# Patient Record
Sex: Male | Born: 1980 | Race: White | Hispanic: No | Marital: Single | Smoking: Never smoker
Health system: Southern US, Community
[De-identification: ages and names within clinical notes are randomized; demographics above are authoritative.]

## PROBLEM LIST (undated history)

## (undated) DIAGNOSIS — L0390 Cellulitis, unspecified: Secondary | ICD-10-CM

## (undated) DIAGNOSIS — I10 Essential (primary) hypertension: Secondary | ICD-10-CM

## (undated) DIAGNOSIS — E7800 Pure hypercholesterolemia, unspecified: Secondary | ICD-10-CM

## (undated) DIAGNOSIS — E119 Type 2 diabetes mellitus without complications: Secondary | ICD-10-CM

## (undated) DIAGNOSIS — G4730 Sleep apnea, unspecified: Secondary | ICD-10-CM

## (undated) HISTORY — PX: OTHER SURGICAL HISTORY: SHX169

## (undated) HISTORY — DX: Type 2 diabetes mellitus without complications: E11.9

---

## 2007-09-28 ENCOUNTER — Inpatient Hospital Stay (HOSPITAL_COMMUNITY): Admission: EM | Admit: 2007-09-28 | Discharge: 2007-10-03 | Payer: Self-pay | Admitting: Emergency Medicine

## 2007-09-29 ENCOUNTER — Encounter (INDEPENDENT_AMBULATORY_CARE_PROVIDER_SITE_OTHER): Payer: Self-pay | Admitting: Internal Medicine

## 2007-09-29 ENCOUNTER — Ambulatory Visit: Payer: Self-pay | Admitting: Vascular Surgery

## 2007-12-13 ENCOUNTER — Emergency Department (HOSPITAL_COMMUNITY): Admission: EM | Admit: 2007-12-13 | Discharge: 2007-12-13 | Payer: Self-pay | Admitting: Emergency Medicine

## 2008-07-22 ENCOUNTER — Inpatient Hospital Stay (HOSPITAL_COMMUNITY): Admission: EM | Admit: 2008-07-22 | Discharge: 2008-07-27 | Payer: Self-pay | Admitting: Emergency Medicine

## 2009-04-11 ENCOUNTER — Ambulatory Visit: Payer: Self-pay | Admitting: Diagnostic Radiology

## 2009-04-11 ENCOUNTER — Emergency Department (HOSPITAL_BASED_OUTPATIENT_CLINIC_OR_DEPARTMENT_OTHER): Admission: EM | Admit: 2009-04-11 | Discharge: 2009-04-11 | Payer: Self-pay | Admitting: Emergency Medicine

## 2009-07-26 ENCOUNTER — Other Ambulatory Visit: Payer: Self-pay | Admitting: Emergency Medicine

## 2009-07-26 ENCOUNTER — Inpatient Hospital Stay (HOSPITAL_COMMUNITY): Admission: EM | Admit: 2009-07-26 | Discharge: 2009-07-28 | Payer: Self-pay

## 2010-05-22 LAB — BASIC METABOLIC PANEL
BUN: 13 mg/dL (ref 6–23)
Creatinine, Ser: 0.8 mg/dL (ref 0.4–1.5)
GFR calc non Af Amer: >60 mL/min (ref >60)

## 2010-05-22 LAB — COMPREHENSIVE METABOLIC PANEL
AST: 15 U/L (ref 0–37)
Albumin: 3.4 g/dL — ABNORMAL LOW (ref 3.5–5.2)
BUN: 8 mg/dL (ref 6–23)
Creatinine, Ser: 0.9 mg/dL (ref 0.4–1.5)
Glucose, Bld: 130 mg/dL — ABNORMAL HIGH (ref 70–99)
Sodium: 137 mEq/L (ref 135–145)
Total Bilirubin: 2.2 mg/dL — ABNORMAL HIGH (ref 0.3–1.2)
Total Protein: 6.9 g/dL (ref 6.0–8.3)

## 2010-05-22 LAB — LIPID PANEL
Cholesterol: 95 mg/dL (ref 0–200)
Total CHOL/HDL Ratio: 2.3 RATIO
VLDL: 8 mg/dL (ref 0–40)

## 2010-05-22 LAB — CULTURE, BLOOD (ROUTINE X 2): Culture: NO GROWTH

## 2010-05-22 LAB — DIFFERENTIAL
Basophils Absolute: 0.2 10*3/uL — ABNORMAL HIGH (ref 0.0–0.1)
Basophils Relative: 1 % (ref 0–1)
Eosinophils Absolute: 0 10*3/uL (ref 0.0–0.7)
Monocytes Absolute: 0.9 10*3/uL (ref 0.1–1.0)
Monocytes Absolute: 1.1 10*3/uL — ABNORMAL HIGH (ref 0.1–1.0)
Monocytes Relative: 8 % (ref 3–12)
Monocytes Relative: 8 % (ref 3–12)

## 2010-05-22 LAB — CBC
Hemoglobin: 14.5 g/dL (ref 13.0–17.0)
Hemoglobin: 15 g/dL (ref 13.0–17.0)
MCV: 89.4 fL (ref 78.0–100.0)
MCV: 90.7 fL (ref 78.0–100.0)
RBC: 4.53 MIL/uL (ref 4.22–5.81)
RBC: 4.8 MIL/uL (ref 4.22–5.81)
WBC: 12.6 10*3/uL — ABNORMAL HIGH (ref 4.0–10.5)
WBC: 13.4 10*3/uL — ABNORMAL HIGH (ref 4.0–10.5)

## 2010-05-22 LAB — PHOSPHORUS: Phosphorus: 2 mg/dL — ABNORMAL LOW (ref 2.3–4.6)

## 2010-05-22 LAB — HEMOGLOBIN A1C: Hgb A1c MFr Bld: 4.9 % (ref <5.7)

## 2010-05-24 IMAGING — CR DG CHEST 2V
2 series · 2 of 2 positions shown · non-contrast
Comparison: None

CLINICAL DATA: Fever and cough

CHEST - 2 VIEW

[w chest pa *]
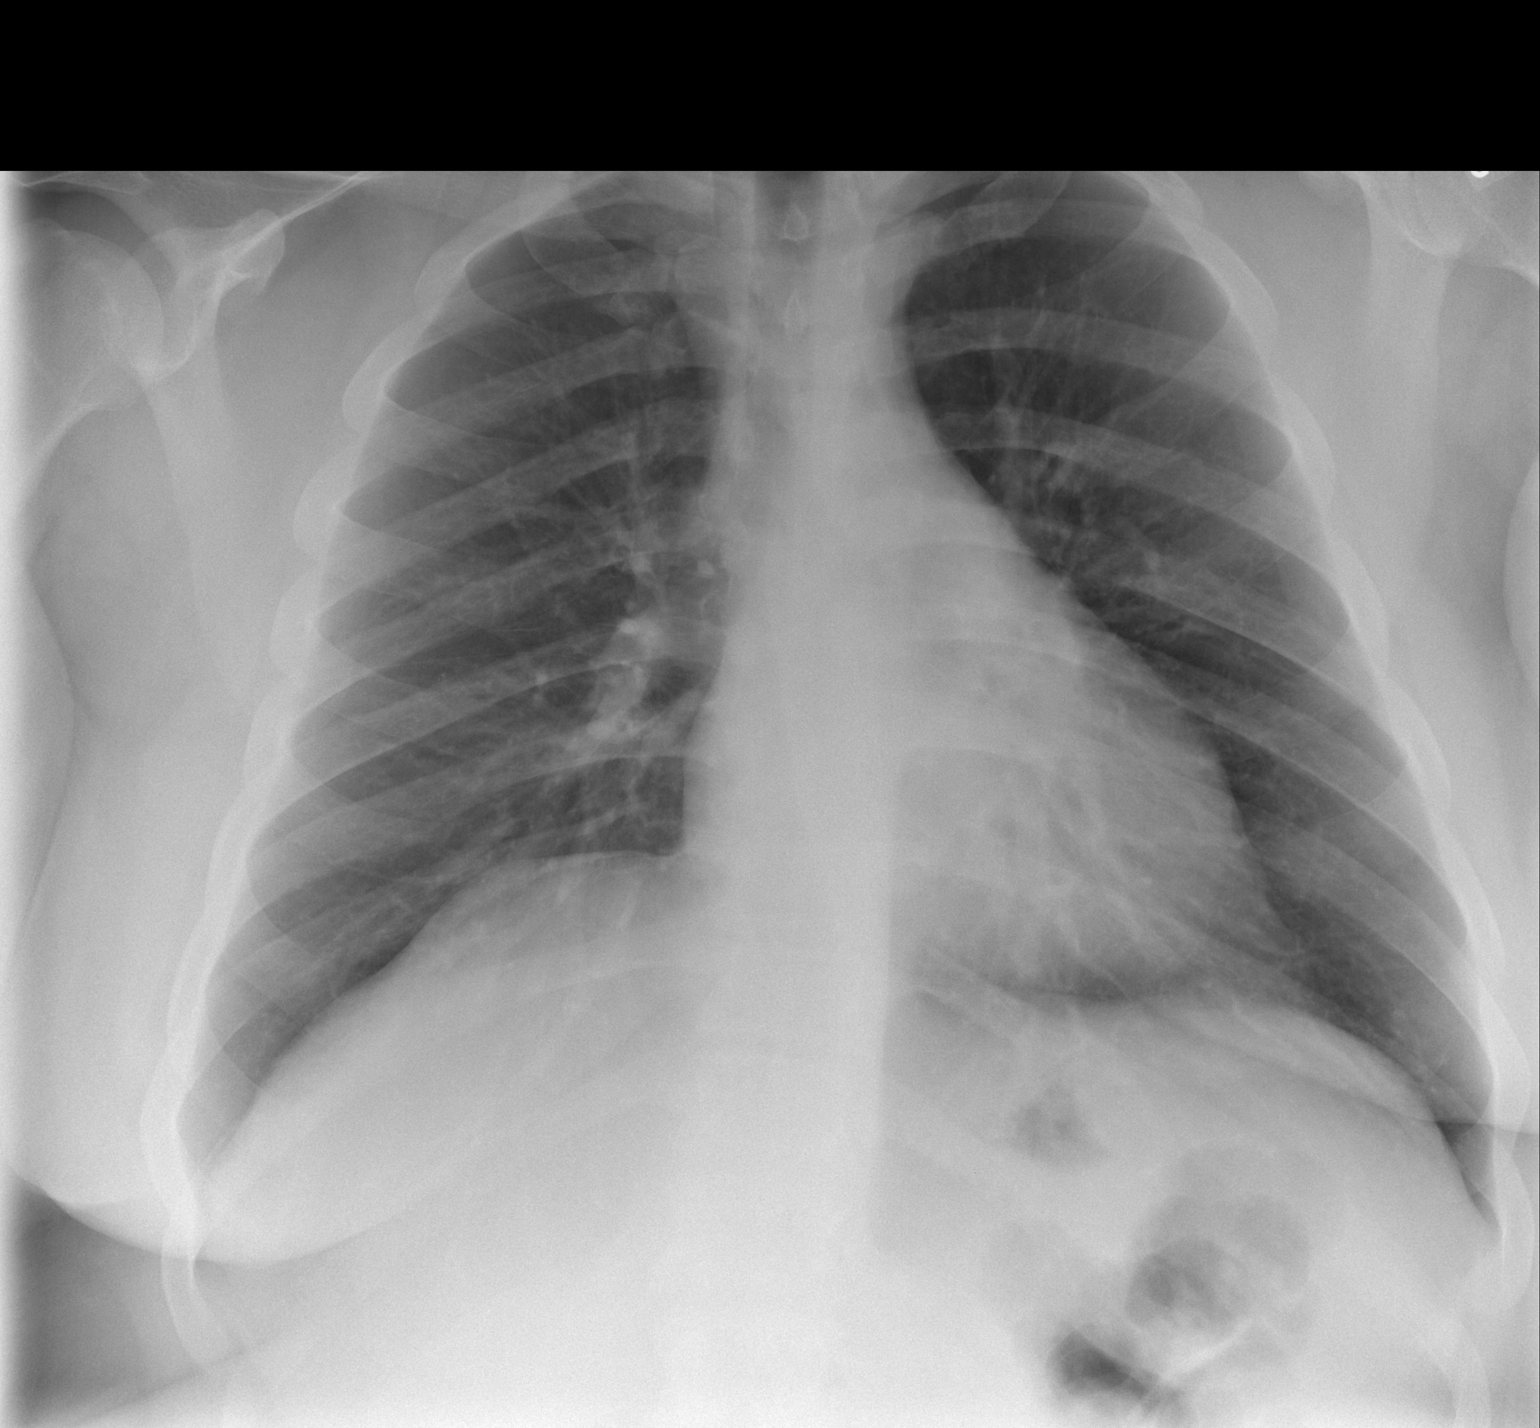

[w chest lat *]
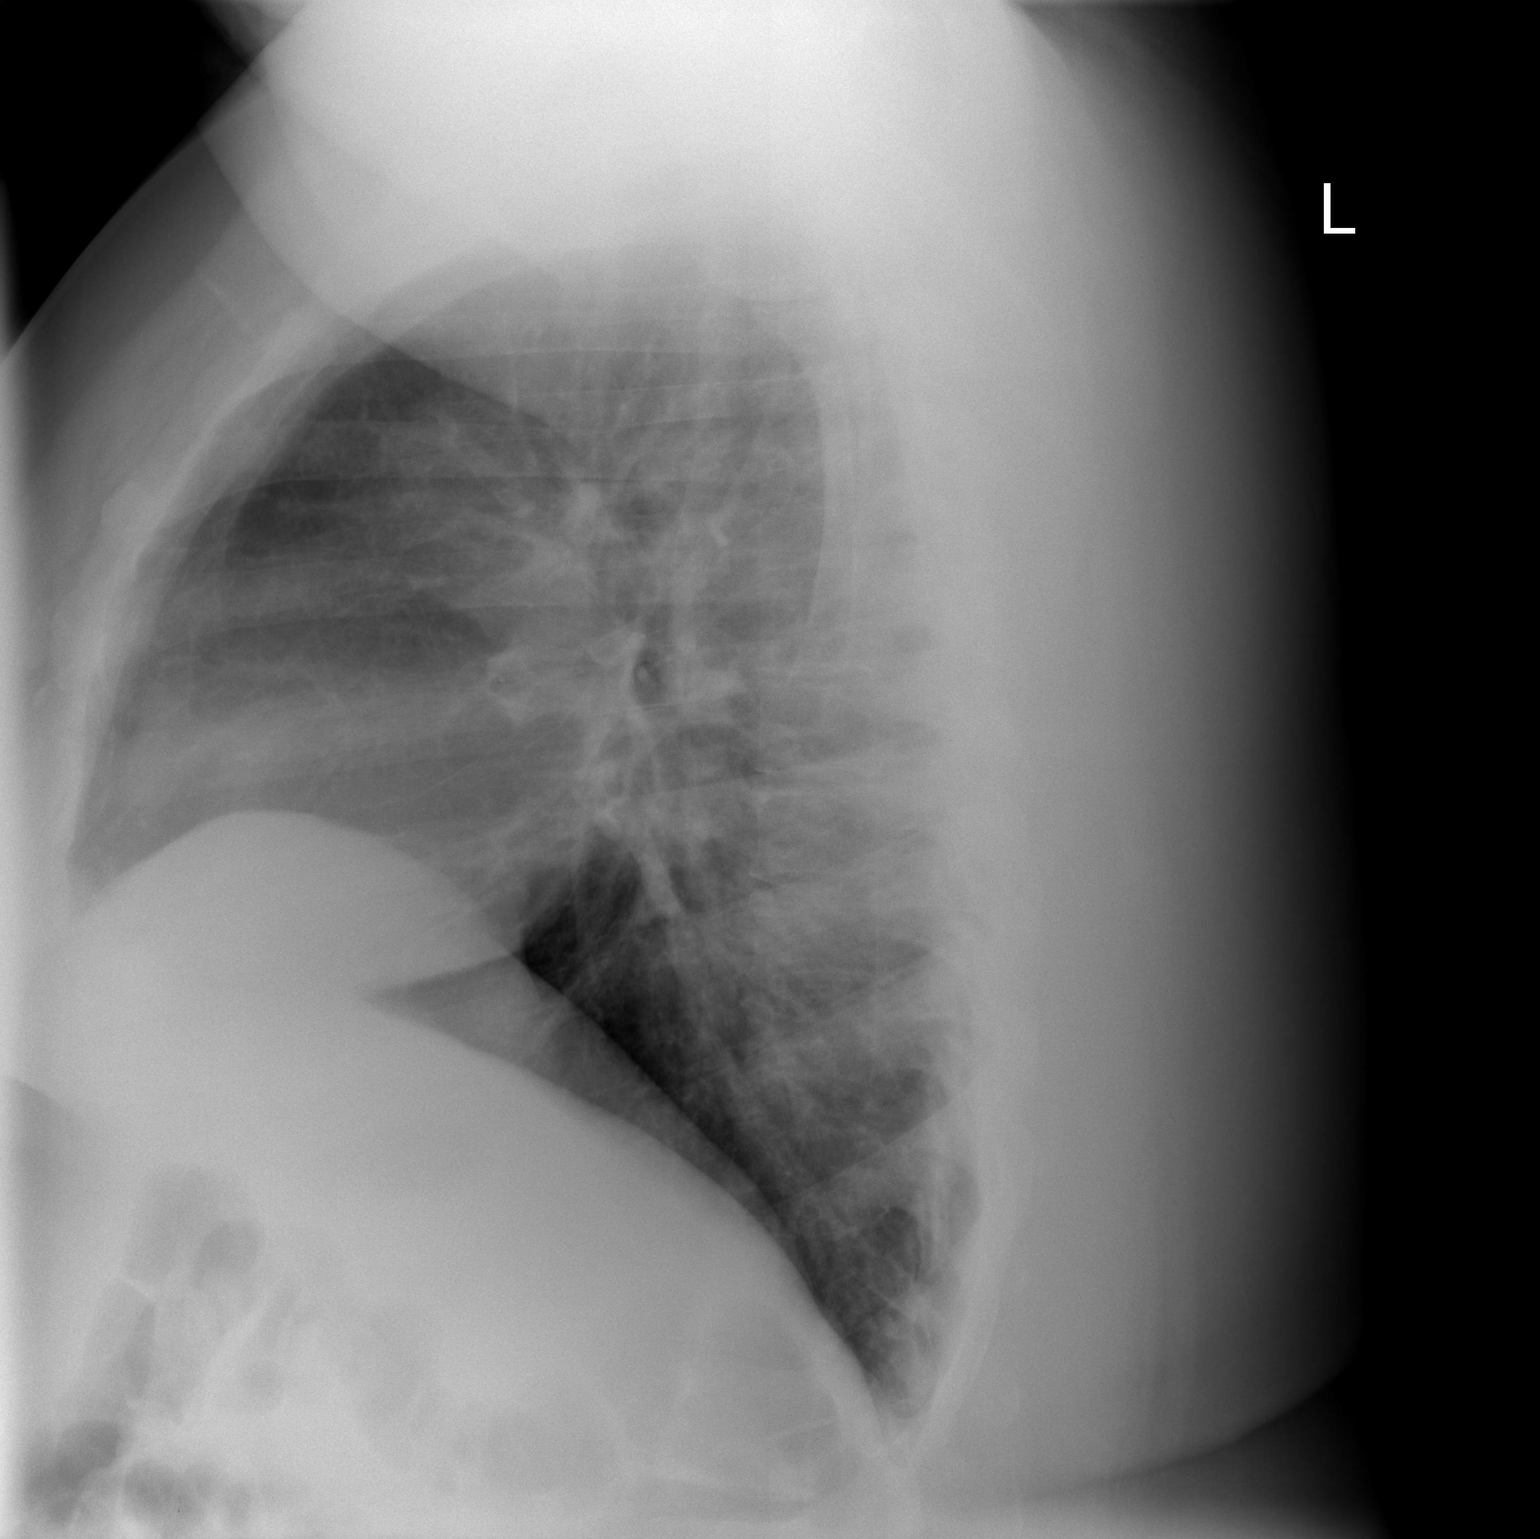

[2 of 2 positions shown; findings below may reference images not displayed]

FINDINGS: The heart size and mediastinal contours are within normal
limits.  Both lungs are clear.  The visualized skeletal structures
are unremarkable.
IMPRESSION: No active cardiopulmonary disease.

## 2010-06-08 ENCOUNTER — Emergency Department (HOSPITAL_BASED_OUTPATIENT_CLINIC_OR_DEPARTMENT_OTHER)
Admission: EM | Admit: 2010-06-08 | Discharge: 2010-06-09 | Disposition: A | Discharge status: Nursing Home (Includes ICF) | Payer: Self-pay | Source: Home / Self Care | Attending: Emergency Medicine | Admitting: Emergency Medicine

## 2010-06-08 ENCOUNTER — Emergency Department (INDEPENDENT_AMBULATORY_CARE_PROVIDER_SITE_OTHER): Payer: Self-pay

## 2010-06-08 DIAGNOSIS — R509 Fever, unspecified: Secondary | ICD-10-CM

## 2010-06-08 DIAGNOSIS — R079 Chest pain, unspecified: Secondary | ICD-10-CM

## 2010-06-08 DIAGNOSIS — M79609 Pain in unspecified limb: Secondary | ICD-10-CM | POA: Insufficient documentation

## 2010-06-08 DIAGNOSIS — L02419 Cutaneous abscess of limb, unspecified: Secondary | ICD-10-CM | POA: Insufficient documentation

## 2010-06-08 DIAGNOSIS — M7989 Other specified soft tissue disorders: Secondary | ICD-10-CM

## 2010-06-08 DIAGNOSIS — L03119 Cellulitis of unspecified part of limb: Secondary | ICD-10-CM | POA: Insufficient documentation

## 2010-06-08 DIAGNOSIS — J189 Pneumonia, unspecified organism: Secondary | ICD-10-CM | POA: Insufficient documentation

## 2010-06-08 DIAGNOSIS — A419 Sepsis, unspecified organism: Secondary | ICD-10-CM | POA: Insufficient documentation

## 2010-06-08 DIAGNOSIS — K219 Gastro-esophageal reflux disease without esophagitis: Secondary | ICD-10-CM | POA: Insufficient documentation

## 2010-06-08 DIAGNOSIS — F17200 Nicotine dependence, unspecified, uncomplicated: Secondary | ICD-10-CM | POA: Insufficient documentation

## 2010-06-08 LAB — URINALYSIS, ROUTINE W REFLEX MICROSCOPIC
Glucose, UA: NEGATIVE mg/dL
Ketones, ur: 15 mg/dL — AB
Nitrite: NEGATIVE
Protein, ur: 100 mg/dL — AB
Specific Gravity, Urine: 1.035 — ABNORMAL HIGH (ref 1.005–1.030)
Urobilinogen, UA: 1 mg/dL (ref 0.0–1.0)
pH: 7 (ref 5.0–8.0)

## 2010-06-08 LAB — COMPREHENSIVE METABOLIC PANEL
AST: 32 U/L (ref 0–37)
Albumin: 4.4 g/dL (ref 3.5–5.2)
Alkaline Phosphatase: 63 U/L (ref 39–117)
Chloride: 102 mEq/L (ref 96–112)
Creatinine, Ser: 0.8 mg/dL (ref 0.4–1.5)
Potassium: 4.7 mEq/L (ref 3.5–5.1)
Sodium: 143 mEq/L (ref 135–145)
Total Bilirubin: 2.8 mg/dL — ABNORMAL HIGH (ref 0.3–1.2)
Total Protein: 7.9 g/dL (ref 6.0–8.3)

## 2010-06-08 LAB — DIFFERENTIAL
Band Neutrophils: 2 % (ref 0–10)
Basophils Absolute: 0 10*3/uL (ref 0.0–0.1)
Basophils Relative: 0 % (ref 0–1)
Eosinophils Absolute: 0 10*3/uL (ref 0.0–0.7)
Lymphs Abs: 1.1 10*3/uL (ref 0.7–4.0)
Monocytes Absolute: 0.9 10*3/uL (ref 0.1–1.0)
Monocytes Relative: 6 % (ref 3–12)
Neutro Abs: 13.8 10*3/uL — ABNORMAL HIGH (ref 1.7–7.7)

## 2010-06-08 LAB — POCT I-STAT 3, ART BLOOD GAS (G3+)

## 2010-06-08 LAB — URINE MICROSCOPIC-ADD ON

## 2010-06-08 LAB — CBC: MCHC: 35.9 g/dL (ref 30.0–36.0)

## 2010-06-09 ENCOUNTER — Inpatient Hospital Stay (HOSPITAL_COMMUNITY)
Admission: AD | Admit: 2010-06-09 | Discharge: 2010-06-15 | DRG: 871 | Disposition: A | Discharge status: Home / Self Care | Payer: Self-pay | Source: Other Acute Inpatient Hospital | Attending: Internal Medicine | Admitting: Internal Medicine

## 2010-06-09 ENCOUNTER — Inpatient Hospital Stay (HOSPITAL_COMMUNITY): Payer: Self-pay

## 2010-06-09 DIAGNOSIS — M7989 Other specified soft tissue disorders: Secondary | ICD-10-CM

## 2010-06-09 DIAGNOSIS — D72829 Elevated white blood cell count, unspecified: Secondary | ICD-10-CM | POA: Diagnosis present

## 2010-06-09 DIAGNOSIS — L02419 Cutaneous abscess of limb, unspecified: Secondary | ICD-10-CM | POA: Diagnosis present

## 2010-06-09 DIAGNOSIS — Z87891 Personal history of nicotine dependence: Secondary | ICD-10-CM

## 2010-06-09 DIAGNOSIS — B353 Tinea pedis: Secondary | ICD-10-CM | POA: Diagnosis present

## 2010-06-09 DIAGNOSIS — A419 Sepsis, unspecified organism: Principal | ICD-10-CM | POA: Diagnosis present

## 2010-06-09 DIAGNOSIS — D638 Anemia in other chronic diseases classified elsewhere: Secondary | ICD-10-CM | POA: Diagnosis not present

## 2010-06-09 DIAGNOSIS — J9819 Other pulmonary collapse: Secondary | ICD-10-CM | POA: Diagnosis present

## 2010-06-09 DIAGNOSIS — J189 Pneumonia, unspecified organism: Secondary | ICD-10-CM | POA: Diagnosis present

## 2010-06-09 DIAGNOSIS — Z6841 Body Mass Index (BMI) 40.0 and over, adult: Secondary | ICD-10-CM

## 2010-06-09 LAB — HEMOGLOBIN A1C
Hgb A1c MFr Bld: 5.1 % (ref <5.7)
Mean Plasma Glucose: 100 mg/dL (ref <117)

## 2010-06-09 LAB — CBC
MCH: 31.4 pg (ref 26.0–34.0)
MCHC: 35.6 g/dL (ref 30.0–36.0)
Platelets: 178 10*3/uL (ref 150–400)
RDW: 12.8 % (ref 11.5–15.5)

## 2010-06-09 LAB — COMPREHENSIVE METABOLIC PANEL
AST: 22 U/L (ref 0–37)
Albumin: 3.4 g/dL — ABNORMAL LOW (ref 3.5–5.2)
Calcium: 8.6 mg/dL (ref 8.4–10.5)
Creatinine, Ser: 0.8 mg/dL (ref 0.4–1.5)
GFR calc Af Amer: >60 mL/min (ref >60)
GFR calc non Af Amer: >60 mL/min (ref >60)
Sodium: 138 mEq/L (ref 135–145)
Total Protein: 6.7 g/dL (ref 6.0–8.3)

## 2010-06-09 LAB — MRSA PCR SCREENING: MRSA by PCR: NEGATIVE

## 2010-06-10 ENCOUNTER — Inpatient Hospital Stay (HOSPITAL_COMMUNITY): Payer: Self-pay

## 2010-06-10 LAB — CBC
MCH: 30.6 pg (ref 26.0–34.0)
Platelets: 144 10*3/uL — ABNORMAL LOW (ref 150–400)
RBC: 4.05 MIL/uL — ABNORMAL LOW (ref 4.22–5.81)
RDW: 13 % (ref 11.5–15.5)
WBC: 12.9 10*3/uL — ABNORMAL HIGH (ref 4.0–10.5)

## 2010-06-10 LAB — URINE CULTURE
Colony Count: NO GROWTH
Culture  Setup Time: 201204060751
Culture: NO GROWTH

## 2010-06-11 ENCOUNTER — Inpatient Hospital Stay (HOSPITAL_COMMUNITY): Payer: Self-pay

## 2010-06-11 LAB — CBC
HCT: 35.3 % — ABNORMAL LOW (ref 39.0–52.0)
Hemoglobin: 12.3 g/dL — ABNORMAL LOW (ref 13.0–17.0)
MCH: 30.8 pg (ref 26.0–34.0)
MCHC: 34.8 g/dL (ref 30.0–36.0)
RDW: 13 % (ref 11.5–15.5)

## 2010-06-11 LAB — BASIC METABOLIC PANEL
BUN: 7 mg/dL (ref 6–23)
CO2: 26 mEq/L (ref 19–32)
Calcium: 8.4 mg/dL (ref 8.4–10.5)
Creatinine, Ser: 0.68 mg/dL (ref 0.4–1.5)
GFR calc non Af Amer: >60 mL/min (ref >60)
Glucose, Bld: 126 mg/dL — ABNORMAL HIGH (ref 70–99)
Sodium: 139 mEq/L (ref 135–145)

## 2010-06-11 LAB — FERRITIN: Ferritin: 464 ng/mL — ABNORMAL HIGH (ref 22–322)

## 2010-06-11 LAB — IRON AND TIBC
Iron: 27 ug/dL — ABNORMAL LOW (ref 42–135)
UIBC: 196 ug/dL

## 2010-06-12 DIAGNOSIS — L03119 Cellulitis of unspecified part of limb: Secondary | ICD-10-CM

## 2010-06-12 DIAGNOSIS — L02419 Cutaneous abscess of limb, unspecified: Secondary | ICD-10-CM

## 2010-06-12 LAB — VANCOMYCIN, TROUGH: Vancomycin Tr: 14.4 ug/mL (ref 10.0–20.0)

## 2010-06-13 DIAGNOSIS — L03119 Cellulitis of unspecified part of limb: Secondary | ICD-10-CM

## 2010-06-13 DIAGNOSIS — L02419 Cutaneous abscess of limb, unspecified: Secondary | ICD-10-CM

## 2010-06-13 LAB — CBC
HCT: 37.4 % — ABNORMAL LOW (ref 39.0–52.0)
HCT: 37.4 % — ABNORMAL LOW (ref 39.0–52.0)
HCT: 42.5 % (ref 39.0–52.0)
Hemoglobin: 12.9 g/dL — ABNORMAL LOW (ref 13.0–17.0)
Hemoglobin: 13 g/dL (ref 13.0–17.0)
Hemoglobin: 14.6 g/dL (ref 13.0–17.0)
MCHC: 34.6 g/dL (ref 30.0–36.0)
MCV: 90.8 fL (ref 78.0–100.0)
MCV: 90.8 fL (ref 78.0–100.0)
RBC: 4.12 MIL/uL — ABNORMAL LOW (ref 4.22–5.81)
RDW: 12.6 % (ref 11.5–15.5)
RDW: 12.7 % (ref 11.5–15.5)
RDW: 12.8 % (ref 11.5–15.5)
WBC: 16.3 10*3/uL — ABNORMAL HIGH (ref 4.0–10.5)
WBC: 9.6 10*3/uL (ref 4.0–10.5)

## 2010-06-13 LAB — POCT I-STAT, CHEM 8
Creatinine, Ser: 1.1 mg/dL (ref 0.4–1.5)
Glucose, Bld: 111 mg/dL — ABNORMAL HIGH (ref 70–99)
HCT: 44 % (ref 39.0–52.0)
Hemoglobin: 15 g/dL (ref 13.0–17.0)
Potassium: 3.7 mEq/L (ref 3.5–5.1)
Sodium: 138 mEq/L (ref 135–145)
TCO2: 24 mmol/L (ref 0–100)

## 2010-06-13 LAB — CULTURE, BLOOD (ROUTINE X 2)
Culture: NO GROWTH
Culture: NO GROWTH

## 2010-06-13 LAB — BASIC METABOLIC PANEL
Chloride: 107 mEq/L (ref 96–112)
GFR calc non Af Amer: >60 mL/min (ref >60)
Glucose, Bld: 102 mg/dL — ABNORMAL HIGH (ref 70–99)
Potassium: 4.2 mEq/L (ref 3.5–5.1)
Sodium: 138 mEq/L (ref 135–145)

## 2010-06-13 LAB — COMPREHENSIVE METABOLIC PANEL
ALT: 24 U/L (ref 0–53)
BUN: 10 mg/dL (ref 6–23)
CO2: 21 mEq/L (ref 19–32)
Calcium: 8.3 mg/dL — ABNORMAL LOW (ref 8.4–10.5)
Creatinine, Ser: 0.84 mg/dL (ref 0.4–1.5)
GFR calc non Af Amer: >60 mL/min (ref >60)
Glucose, Bld: 94 mg/dL (ref 70–99)
Total Protein: 5.9 g/dL — ABNORMAL LOW (ref 6.0–8.3)

## 2010-06-13 LAB — VANCOMYCIN, TROUGH: Vancomycin Tr: 22.1 ug/mL — ABNORMAL HIGH (ref 10.0–20.0)

## 2010-06-13 LAB — DIFFERENTIAL
Eosinophils Absolute: 0 10*3/uL (ref 0.0–0.7)
Eosinophils Absolute: 0.1 10*3/uL (ref 0.0–0.7)
Lymphocytes Relative: 14 % (ref 12–46)
Lymphs Abs: 0.5 10*3/uL — ABNORMAL LOW (ref 0.7–4.0)
Lymphs Abs: 1.2 10*3/uL (ref 0.7–4.0)
Monocytes Relative: 4 % (ref 3–12)
Neutro Abs: 15.1 10*3/uL — ABNORMAL HIGH (ref 1.7–7.7)
Neutro Abs: 7.1 10*3/uL (ref 1.7–7.7)
Neutrophils Relative %: 80 % — ABNORMAL HIGH (ref 43–77)
Neutrophils Relative %: 93 % — ABNORMAL HIGH (ref 43–77)

## 2010-06-13 LAB — HEMOGLOBIN A1C
Hgb A1c MFr Bld: 4.6 % (ref 4.6–6.1)
Mean Plasma Glucose: 85 mg/dL

## 2010-06-13 LAB — LIPID PANEL
Cholesterol: 81 mg/dL (ref 0–200)
LDL Cholesterol: 38 mg/dL (ref 0–99)
Triglycerides: 57 mg/dL (ref <150)

## 2010-06-13 LAB — CULTURE, RESPIRATORY W GRAM STAIN: Culture: NORMAL

## 2010-06-13 LAB — EXPECTORATED SPUTUM ASSESSMENT W GRAM STAIN, RFLX TO RESP C

## 2010-06-15 LAB — CULTURE, BLOOD (ROUTINE X 2)
Culture  Setup Time: 201204060025
Culture  Setup Time: 201204060841
Culture: NO GROWTH

## 2010-06-15 LAB — CBC
HCT: 39.5 % (ref 39.0–52.0)
MCV: 89 fL (ref 78.0–100.0)
RBC: 4.44 MIL/uL (ref 4.22–5.81)
WBC: 10.9 10*3/uL — ABNORMAL HIGH (ref 4.0–10.5)

## 2010-06-16 LAB — CULTURE, BLOOD (ROUTINE X 2)
Culture  Setup Time: 201204071719
Culture  Setup Time: 201204071719
Culture: NO GROWTH

## 2010-06-26 NOTE — Discharge Summary (Signed)
  [name], [name] NO.:  [id number]  MEDICAL RECORD NO.:  [id number]           PATIENT TYPE:  I  LOCATION:  5525                         FACILITY:  [facility]  PHYSICIAN:  Zannie Cove, MD     DATE OF BIRTH:  [birth year]  DATE OF ADMISSION:  06/09/2010 DATE OF DISCHARGE:                              DISCHARGE SUMMARY   PRIMARY CARE PHYSICIAN:  None.  Going to follow up at [facility]  DISCHARGE DIAGNOSES: 1. Sepsis secondary to cellulitis. 2. Right lower extremity cellulitis. 3. Morbid obesity. 4. Tobacco use. 5. Atelectasis.  DISCHARGE MEDICATIONS: 1. Keflex 500 mg p.o. q.i.d. for 7 days. 2. Doxycycline 100 mg p.o. b.i.d. for 7 days. 3. Extra Strength Tylenol 500 mg p.o. q.6 h p.r.n.  CONSULTANT:  Dr. Paulette Blanch Dam, Infectious Disease.  HOSPITAL COURSE: 1. [name] is a 30 year old gentleman with morbid obesity and     suspected mild venous insufficiency and swelling presented to the     hospital with right lower extremity cellulitis.  He was seen by     Infectious Disease in consultation.  Initially, he was treated with     IV vancomycin, subsequently transitioned to p.o. Keflex and     doxycycline and plan for IV was to complete a 14-day course of     antibiotics with discharge home on Keflex and doxy and hence the     patient since had maximum benefit with IV hospitalization is being     discharged home on seven more days of Keflex and doxycycline.  He     was also given compression stockings for his lower extremities. 2. Morbid obesity, advised on lifestyle modification.  He had a     hemoglobin A1c, which was normal and his blood pressures have been     unremarkable during his hospital course as well.  Discharge     condition was stable.  Vital signs at discharge temperature is     98.4, pulse 84, blood pressure 120/79, respirations 20, satting 96%     on room air.  DISCHARGE FOLLOWUP:  [facility] with Dr. Delrae Alfred on Jul 14, 2010  and eligibility appointment on Jul 06, 2010.     Zannie Cove, MD     PJ/MEDQ  D:  06/15/2010  T:  06/15/2010  Job:  [id number]  cc:   Marcene Duos, M.D.  Electronically Signed by Zannie Cove  on 06/26/2010 08:08:56 PM

## 2010-07-16 NOTE — H&P (Signed)
[name], French                ACCOUNT NO.:  [id number]  MEDICAL RECORD NO.:  [id number]           PATIENT TYPE:  I  LOCATION:  3312                         FACILITY:  [facility]  PHYSICIAN:  Eduard Clos, MDDATE OF BIRTH:  [birth year]  DATE OF ADMISSION:  06/09/2010 DATE OF DISCHARGE:                             HISTORY & PHYSICAL   PRIMARY CARE PHYSICIAN:  Unassigned.  CHIEF COMPLAINT:  Right lower extremity swelling with fever and chills.  HISTORY OF PRESENT ILLNESS:  A 30 year old male with history of morbid obesity and recurrent cellulitis presented to the ER because of fever or chills and right lower extremity erythema and swelling.  The patient stated that he was doing fine.  Two days ago, he noticed some swelling in the left lower extremity which got better with Motrin, but yesterday morning he got fever, chills, and started developing pain in his groin and right lower extremity which have eventually become erythematous and swollen.  He came to the ER.  The patient was found to be febrile, tachycardic, mildly elevated lactic acid.  Initially, PCCM was considered by ER physician, but he told hospice can be admitted.  At this time, the patient has been brought to step-down unit at [facility] from [facility] for admission for his cellulitis with SIRS.  In addition, his chest x-ray was showing possibility of a lingular pneumonia.  The patient denies any cough or phlegm.  He denies any nausea, vomiting, chest pain, shortness of breath.  Denies any abdominal pain, dysuria, discharge, diarrhea.  The patient does complain of some mild pain in the right lower extremity.  PAST MEDICAL HISTORY:  History of recurrent cellulitis, morbid obesity, and history of elevated blood pressure.  PAST SURGICAL HISTORY:  Debridement of his left leg wound.  ALLERGIES:  He is allergic to PENICILLIN which causes a rash.  SOCIAL HISTORY:  The patient smokes cigarettes occasionally, last  time he smoked was more than a year ago.  Drinks alcohol recreationally, last drink was 4 months ago.  He denies any drug abuse.  He is not married.  FAMILY HISTORY:  Significant for diabetes in his sister and mother.  REVIEW OF SYSTEMS:  As per history of present illness, nothing else significant.  PHYSICAL EXAMINATION:  GENERAL:  The patient examined at bedside not in acute distress. VITAL SIGNS:  Blood pressure is 130/80, pulse of 110 per minute, temperature 102, respirations 18, O2 sat 100%. HEENT:  Anicteric.  No pallor.  No discharge from ears, eyes, nose, or mouth. CHEST:  Bilateral air entry present.  No rhonchi.  No crepitation. HEART:  S1 and S2 heard. ABDOMEN:  Soft, nontender.  Bowel sounds are heard. CNS:  The patient alert, awake, oriented to time, place, and person. EXTREMITIES:  Moves upper and lower extremities 5/5.  There is swelling in right lower extremity extending from his ankle up to his inferior aspect of the right knee.  The patient is able to move his joints without any problem with mild erythema in the left posterior aspect of the shin in the lower aspect.  There are no acute ischemic changes,  cyanosis, or clubbing.  The patient also has mild tenderness in the right groin.  LABORATORY DATA:  X-ray of the right tibia-fibula shows no acute bony abnormalities, soft tissue swelling.  X-ray of the chest suspect lingular pneumonia.  ABG, pH is 7.42, pCO2 is 35.9 with pO2 of 74, oxygen saturation 93%.  CBC, WBCs 15.8, hemoglobin is 15, hematocrit is 41.8, platelets 171, bands 2, neutrophils 85%.  Complete metabolic panel, sodium 143, potassium 4.7, chloride 102, carbon dioxide 25, glucose 132, BUN 12, creatinine 0.8.  Total bilirubin is 2.8, AST 32, ALT 31, total protein is 7.9, albumin 4.4, calcium 9.3, lactic is 2.9. UA shows ketones 50, glucose negative, nitrites and leukocytes are negative.  Cultures are pending.  ASSESSMENT: 1. Sepsis secondary to  cellulitis of the right lower extremity. 2. Morbid obesity. 3. Possible lingular pneumonia. 4. History of cigarette smoking.  PLAN: 1. At this time, we will admit the patient to step-down unit. 2. For his sepsis and cellulitis, the patient will be aggressively     hydrated with IV fluids.  The patient will be on IV antibiotics.     We will get blood cultures.  Keep his right leg raised.  If his     erythema does not get better, then we need to get a CAT scan or MRI     of the right lower extremity and make sure if there is any evolving     abscess formation. 3. Possible lingular pneumonia.  At this time, the patient clinically     does not look like he has an ammonia process going on.  I wanted to     do a chest x-ray, and if there is still persistent infiltrate given     his history of cigarette smoking, the patient eventually will need     a CAT scan. 4. Further recommendations based on the test order.     Eduard Clos, MD     ANK/MEDQ  D:  06/09/2010  T:  06/09/2010  Job:  [id number]  Electronically Signed by Midge Minium MD on 07/16/2010 08:02:48 AM

## 2010-07-18 NOTE — Discharge Summary (Signed)
NAMEZOLTON, DOWSON                ACCOUNT NO.:  [id number]   MEDICAL RECORD NO.:  [id number]          PATIENT TYPE:  INP   LOCATION:  1337                         [facility]AC[facility]  [facility]   PHYSICIAN:  Marcellus Scott, MD     DATE OF BIRTH:  01/23/1981   DATE OF ADMISSION:  09/28/2007  DATE OF DISCHARGE:  10/03/2007                               DISCHARGE SUMMARY   PRIMARY MEDICAL DOCTOR:  Gentry Fitz.   DISCHARGE DIAGNOSES:  1. Sepsis secondary to right lower extremity cellulitis, sepsis      resolved.  2. Right lower extremity extensive cellulitis.  3. Coagulase negative Staphylococcus bacteremia.  4. Obesity.  5. Gastroesophageal reflux disease.   DISCHARGE MEDICATIONS:  1. Doxycycline 100 mg p.o. b.i.d. for 5 days.  2. Motrin 400 mg p.o. t.i.d. with meals for 2 days, then t.i.d. with      meals p.r.n.  3. Prilosec over-the-counter 20 mg p.o. daily.   PROCEDURES:  Venous Doppler of the right lower extremity.  Impression;  no evidence of DVT, SVT of Baker cyst.   PERTINENT LABORATORY DATA:  Blood cultures on September 28, 2007, x2 with  coagulase-negative staphylococcus species, sensitive to clindamycin,  erythromycin, gentamicin, levofloxacin, oxacillin, rifampin, vancomycin  and tetracycline.  Resistant to penicillin.   CBCs on October 02, 2007, normal with hemoglobin 14.5, hematocrit 41.2,  white blood cells 8.4, platelets 210.  Basic metabolic panel on October 01, 2007, unremarkable.  BUN was 9, creatinine 0.73.  TSH  2.180.  Hemoglobin A1c 4.8.  Lipid panel with HDL 34, the rest of it within  normal limits.   CONSULTATIONS:  None.   HOSPITAL COURSE AND PATIENT DISPOSITION:  Please refer to the history  and physical note for initial admission details.  In summary, [name]  is a pleasant 30 year old gentleman with history of obesity,  gastroesophageal reflux disease, previous left lower extremity  cellulitis which required I&D in 2000.  He presented with right lower  extremity  redness, pain and swelling, fever, chills and rigor a day or  two after his right leg was wrapped in his friend's dog leash with some  abrasion.  Evaluation in the ED, revealed a temperature of 102 degrees  Fahrenheit with right lower extremity suggestive of severe cellulitis,  but no skin interruption or abscess.  He had a white cell count of  19,000.  He was then admitted to the medical floor for management.   1. Sepsis secondary to right lower extremity cellulitis on admission.      Sepsis resolved.  2. Severe right lower extremity cellulitis.  The patient was placed on      IV Avelox given his penicillin allergy.  The cellulitis area was      demarcated with a marker.  DVT was ruled out by doing a venous      Doppler.  There was no initial suspicion for staph infection,      whereby vancomycin was not added.  However, subsequently his blood      culture showed some staph aureus following which vancomycin was  added on day 3 of his admission.  With these measures, the      patient's cellulitis has progressively continued to improve.  Over      the last 72 hours, the patient has had no pain in the right lower      extremity.  He is able to ambulate without any difficulty.  There      is only faint redness, which is patchy in the middle of the right      leg and minimal warmth.  His blood cultures finally grew out      coagulase negative staph.  Given the extent of his cellulitis,      would complete a 10 day total course of antibiotics.  The patient      indicates that he will not be able to afford anything more than the      medications through the [facility] 4 dollar prescriptions.      Considering his allergy to penicillin, will discharge the patient      on an additional 5 days of doxycycline to complete a total 10 day      course of antibiotics.  He is to advised to follow up with a      physician in a weeks' time or earlier if there is any deterioration      of his condition.   3. Coagulase-negative staph bacteremia.  Management per problem #2.  4. Obesity.  The patient is quite motivated and indicates that he goes      to a gym regularly and has lost almost 35 pounds of weight over the      last year.  I have encouraged him to continue with those measures.  5. Gastroesophageal reflux disease.  Asymptomatic during this      admission.  To continue Prilosec.  6. Low HDL.  Again, continue exercise and dieting.      Marcellus Scott, MD  Electronically Signed     AH/MEDQ  D:  10/03/2007  T:  10/03/2007  Job:  [id number]   cc:   Della Goo, M.D.  Fax: [phone]

## 2010-07-18 NOTE — H&P (Signed)
[name], French NO.:  [id number]   MEDICAL RECORD NO.:  [id number]          PATIENT TYPE:  EMS   LOCATION:  ED                           FACILITY:  [facility]   PHYSICIAN:  Lonia Blood, M.D.DATE OF BIRTH:  [birth year]   DATE OF ADMISSION:  09/28/2007  DATE OF DISCHARGE:                              HISTORY & PHYSICAL   PRIMARY CARE PHYSICIAN:  Unassigned .   CHIEF COMPLAINT:  Pain in right lower extremity with swelling and  erythema x5 days.   HISTORY OF PRESENT ILLNESS:  [name] is a pleasant obese 30-  year-old gentleman with a past medical history of significant left lower  extremity cellulitis following a blunt injury which required I&D in  2000.  Five days ago he was walking a friend's dog at which time the  leash wrapped around his leg on the right causing a moderate abrasion to  the skin.  There was no significant break of the skin.  In the days  following, the patient has noted increased erythema of the right lower  extremity from below the knee down to the dorsum of the foot and ankle.  Over the last 24 hours there has been significant increase in swelling  and erythema associated with this.  There have been subjective fevers  and chills with rigors.  The patient has had very little p.o. intake due  to severe appetite suppression for approximately 24-36 hours.  He  presented to the emergency room for evaluation where he was diagnosed  with cellulitis and dosed with one dose of Ancef.   REVIEW OF SYSTEMS:  Comprehensive review of systems is otherwise  unremarkable.   PAST MEDICAL HISTORY:  1. Hospitalization for left lower extremity cellulitis status post      blunt injury requiring I&D in 2000.  2. Gastroesophageal reflux disease.  3. Obesity.   OUTPATIENT MEDICATIONS:  None.   ALLERGIES:  PENICILLIN.   FAMILY HISTORY:  Patient's mother is alive and well, but suffers with  severe diabetes and coronary disease.  The patient's  father deceased at  age 71 with complications of diabetes, namely myocardial infarction   SOCIAL HISTORY:  The patient lives in [place].  He is single.  He has  no children.  He is unemployed.  He does not smoke, he does not drink,  he does not use illicit drugs.   DATA REVIEW:  The white count is markedly elevated at 19.7 with a normal  hemoglobin, normal platelet count and a normal MCV.  BMET is  unremarkable.   PHYSICAL EXAMINATION:  Temperature 102.0, blood pressure 135/70, heart  rate 131, respiratory rate 26.  O2 saturation is 97% on room air.  CBG  is 86.   GENERAL:  Well-developed, well-nourished gentleman in no acute  respiratory distress.  LUNGS:  Clear to auscultation bilaterally without wheezes or rhonchi.  CARDIOVASCULAR: Regular rate and rhythm without murmur, gallop, rub.  Normal S1,S2.  ABDOMEN:  Obese, soft, bowel sounds present.  No organomegaly.  No  rebound, no ascites.  EXTREMITIES:  2+ right lower extremity edema with  1+ left lower  extremity edema.  CUTANEOUS:  There is severe erythema and calor in a region just superior  to bilateral malleoli and extending in a circumferential manner of the  lower extremity to approximately 3 inches below the knee.  This is  tender to touch.  There is no palpable core.  There is no significant  focal skin interruption or purulent drainage.  There is no crepitus or  cyanosis.  NEUROLOGIC:  The patient is alert and oriented x4.  Cranial nerves II-  XII intact bilaterally.  He displays 5/5 strength in bilateral upper and  lower extremities. Sensation intact to touch throughout.  VASCULAR:  2+ dorsalis pedis pulses bilaterally.   IMPRESSION AND PLAN:  1. Severe right lower extremity cellulitis.  I will place the patient      on Avelox given his penicillin allergy.  We will follow the      erythema closely. The edge has been delineated with a surgical      marker.  We will also obtain a Doppler evaluation to rule out  deep      vein thrombosis given the patient's obesity, which would predispose      him to deep vein thrombosis.  At the present time I am not      suspicious of community-acquired Methicillin Resistant      Staphylococcus aureus and therefore, I will not observe contact      precautions or add vancomycin.  If he fails to improve, vancomycin      addition will be carried out.  2. Morbid obesity.  We will counsel the patient as to the need to      follow a healthy weight loss plan and as to the multiple      deleterious effects of consistent/ chronic obesity.  3. Gastroesophageal reflux disease.  We will administer Protonix and      follow the patient's symptoms.      Lonia Blood, M.D.  Electronically Signed     JTM/MEDQ  D:  09/28/2007  T:  09/28/2007  Job:  [id number]

## 2010-07-18 NOTE — H&P (Signed)
[name], BEADNELL NO.:  [id number]   MEDICAL RECORD NO.:  [id number]          PATIENT TYPE:  INP   LOCATION:  1513                         FACILITY:  [facility]   PHYSICIAN:  Vania Rea, M.D. DATE OF BIRTH:  [birth year]   DATE OF ADMISSION:  07/22/2008  DATE OF DISCHARGE:                              HISTORY & PHYSICAL   PRIMARY CARE PHYSICIAN:  Unassigned.   CHIEF COMPLAINT:  Pain and swelling of the right lower extremity.   HISTORY OF PRESENT ILLNESS:  This is a 30 year old Caucasian gentleman  with a history of blunt trauma to the left lower extremity in 2000  requiring debridement surgery and who since then has been having  recurrent episodes of cellulitis of both legs.  Patient's last episode  of cellulitis requiring admission and antibiotics therapy was in July  2009.  He has had episodic inflammation and redness of the legs  intermittently since then, which has resolved with just ibuprofen and  rest, but since this morning he started having fever, chills, pain and  swelling and redness of the right lower extremity and went to [facility] where he received an intravenous injection of vancomycin and a  prescription for Keflex.  Patient did not feel significantly improved  and came to our emergency room where he was evaluated and found to have  a continued fever, and the Hospitalist Service was called to assist with  management.  Patient's only other complaints in relation to the leg is  pain in his right groin.  He has no direct history of recent trauma to  the leg.  He does have cracks between his toes that are related to a  rash on his feet.  He says he wears boots and his feet tend to get very  sweaty.   Patient is not having any nausea or vomiting, but he does suffer with  dyspepsia.  He denies headache, chest pain, shortness of breath, or  diarrhea.  He has no diabetes, but he does have a very strong family  history of diabetes.  He is  morbidly obese and says he is actively  trying to lose weight because of his family history.   PAST MEDICAL HISTORY:  1. GERD.  2. Debridement of left calf after blunt trauma in 2000.  3. Morbid obesity.  4. Recurrent cellulitis of both legs.   MEDICATIONS:  Other than noted above, none.   ALLERGIES:  PENICILLIN CAUSES A RASH.   SOCIAL HISTORY:  Denies tobacco, alcohol, or illicit drug use.  Working  temporarily as a Veterinary surgeon.  Strong family history of diabetes  throughout his family.  His father died of an acute MI.  His mother is  status post a recent CABG and a recent stroke and is also diabetic.   REVIEW OF SYSTEMS:  On a 10-point review of systems, other than noted  above was unremarkable.   PHYSICAL EXAMINATION:  GENERAL:  A morbidly obese, Caucasian gentleman  reclining in the stretcher complaining of pain.  He gives his height as  5 feet 9 to 5 feet 10 and  his weight as 370 pounds, which would put his  BMI well over 50. VITALS:  His temperature is 100.9, pulse 106,  respiration 20, blood pressure 100/65, he is saturating at 97% on room  air.  HEENT:  Pupils are round and equal.  Mucous membranes pink, anicteric.  NECK:  He has a very thick neck.  No cervical lymphadenopathy,  thyromegaly, or jugulovenous distention appreciated.  No carotid bruit.  CHEST:  Clear to auscultation bilaterally.  No rubs or crackles heard.  CARDIOVASCULAR:  Distant heart sounds.  No murmur heard.  ABDOMEN:  Morbidly obese and soft.  He has no enterogenous Candida or  rash.  EXTREMITIES:  He has inflammation of the right leg from ankle to knee.  It is red, swollen, and tender.  He has what appears to be a tinea pedis  of both feet with cracks between the fourth and fifth digits.  He has a  papular ulcer on his left thigh, and he is tender over the right femoral  nodes.  CENTRAL NERVOUS SYSTEM:  Cranial nerves II-XII are grossly intact, and  he has no focal neurologic deficit.   LABORATORY  DATA:  Notable for a white count of 16.3 with 93% neutrophils  and an absolute neutrophil count of 15.1.  His hemoglobin is 14.6 and  platelets 184.  His serum chemistry is unremarkable.  His glucose is a  111.   ASSESSMENT:  1. Acute cellulitis of right lower extremity.  2. Gastroesophageal reflux disease (GERD).  3. Morbid obesity.  4. Hyperglycemia.   PLAN:  Will admit this gentleman for treatment with vancomycin  initially.  His cultures on his previous visit grew coag-negative Staph  in both culture bottles.  Blood cultures have already been done, and we  will continue vancomycin.  We will give him a proton pump inhibitor for  his GERD and dyspepsia.  We will check a hemoglobin A1c and keep him on  low carb diet.  Other plans as per orders.      Vania Rea, M.D.  Electronically Signed     LC/MEDQ  D:  07/22/2008  T:  07/22/2008  Job:  [id number]

## 2010-07-18 NOTE — Discharge Summary (Signed)
[name], [name]                ACCOUNT NO.:  [id number]   MEDICAL RECORD NO.:  [id number]          PATIENT TYPE:  INP   LOCATION:  1513                         FACILITY:  [facility]   PHYSICIAN:  Hillery Aldo, M.D.   DATE OF BIRTH:  [birth year]   DATE OF ADMISSION:  07/22/2008  DATE OF DISCHARGE:  07/27/2008                               DISCHARGE SUMMARY   PRIMARY CARE PHYSICIAN:  None.   DISCHARGE DIAGNOSES:  1. Right lower extremity cellulitis, recurrent.  2. Pneumonia.  3. Tinea pedis.  4. Morbid obesity.  5. Gastroesophageal reflux disease.  6. Hypertension.   DISCHARGE MEDICATIONS:  1. Azithromycin 250 mg p.o. daily through Jul 30, 2008.  2. Clindamycin 300 mg p.o. q.6 hours through Aug 01, 2008.  3. Clonidine 0.1 mg p.o. b.i.d.  4. Lotrimin cream to tinea pedis areas b.i.d. until healed.  5. Ibuprofen 600 mg p.o. q.6 h p.r.n. pain or fever.  6. [place] next 1 teaspoon q.12 hours p.r.n. cough.   CONSULTATIONS:  None.   BRIEF ADMISSION HISTORY OF PRESENT ILLNESS:  The patient is a 30-year-  old male with a history of blunt trauma to left lower extremity  requiring debridement and has had problems with recurrent cellulitis of  both legs since that time.  The patient presented to the hospital with  right lower extremity erythema and pain and was treated as an outpatient  with Keflex but failed to achieve any significant results and therefore  presented to the hospital for further evaluation and was admitted for  outpatient failure of therapy.  For full details, please see the  dictated report done by Dr. Orvan Falconer.   PROCEDURES AND DIAGNOSTIC STUDIES:  Chest x-ray on Jul 26, 2008 showed  enlarged vascular markings concerning for congestion or edema.  Increased opacification in the posterior lungs on lateral view which  could represent infection.   DISCHARGE LABORATORY VALUES:  Sputum cultures grew mixed flora.  Sodium  was 138, potassium 4.2, chloride 107, bicarb 25,  BUN 7, creatinine 0.67,  glucose 102, calcium 8.4.  White blood cell count was 9.6, hemoglobin  12.9, hematocrit 37.4, platelets 199,000.   HOSPITAL COURSE BY PROBLEM:  1. Right lower extremity recurrent cellulitis:  The patient was      admitted and empirically put on vancomycin and clindamycin      secondary to penicillin allergy.  His initial white blood cell      count was 16.3 and with initiation of antibiotics, reverted to      normal within 24 hours.  On exam the patient's leg was grossly      erythematous and swollen from his ankle to his knee.  By hospital      day #3, the erythema had begun to lessen and he was transitioned      over to p.o. clindamycin.  At this point, the patient is reporting      decreased pain and swelling and his right lower extremity looks      much improved and we will discharge him on an additional 5 days of  clindamycin for total course of 10 days of therapy.  2. Atypical pneumonia:  The patient was noted to have a persistent      cough throughout his hospital stay prompting [place] to obtain a chest      radiograph on Jul 26, 2008.  Findings were concerning for pneumonia      given the patient's symptoms.  He was felt to be adequately covered      for community acquired pathogens and azithromycin was subsequently      added to cover atypicals.  At this time, the patient continues to      have cough.  However, sputum cultures only grew mixed flora and      this is likely an atypical pneumonia.  The patient will complete 5      days of therapy with a azithromycin on Jul 30, 2008 and will be      discharged with a antitussive medications.  3. Tinea pedis:  This is likely the portal of entry for the patient's      current problems with cellulitis.  He was put on Lotrimin to treat      his underlying fungal infection.  4. Morbid obesity:  The patient is morbidly obese with a weight of      175.40 kg and a BMI of 55.6.  The patient was seen in  consultation      with the dietician and counseled appropriately.  The patient was      able to verbalize guidelines for weight management after this      counseling session.  5. Gastroesophageal reflux disease:  The patient was maintained on      Protonix while in the hospital.  If he becomes symptomatic, he can      use over-the-counter medications as needed as an outpatient.  6. Hypertension.  The patient was noted to have elevated blood      pressure readings at times with diastolics that ranged from 70s to      102.  The patient subsequently was started on clonidine to address      this and he has been counseled regarding the importance of      appropriate follow-up for blood pressure management.   DISPOSITION:  The patient is medically stable and will be discharged  home.  He is encouraged to follow up with a primary care physician is  six week's time.   Time spent coordinating care for discharge and discharge instruction  equals 35 minutes.      Hillery Aldo, M.D.  Electronically Signed     CR/MEDQ  D:  07/27/2008  T:  07/27/2008  Job:  [id number]

## 2010-12-01 LAB — LIPID PANEL
HDL: 34 — ABNORMAL LOW
Total CHOL/HDL Ratio: 2.3
Triglycerides: 72
VLDL: 14

## 2010-12-01 LAB — CULTURE, BLOOD (ROUTINE X 2)

## 2010-12-01 LAB — DIFFERENTIAL
Basophils Absolute: 0
Basophils Relative: 0
Basophils Relative: 0
Eosinophils Absolute: 0
Eosinophils Absolute: 0
Eosinophils Relative: 0
Eosinophils Relative: 2
Lymphocytes Relative: 4 — ABNORMAL LOW
Monocytes Absolute: 0.9
Monocytes Absolute: 1
Monocytes Relative: 7
Monocytes Relative: 9
Neutro Abs: 17.9 — ABNORMAL HIGH
Neutrophils Relative %: 69
Neutrophils Relative %: 83 — ABNORMAL HIGH

## 2010-12-01 LAB — BASIC METABOLIC PANEL
CO2: 24
Chloride: 104
Chloride: 108
Creatinine, Ser: 0.73
GFR calc Af Amer: >60
GFR calc Af Amer: >60
GFR calc non Af Amer: >60
Potassium: 4.1
Potassium: 4.5
Sodium: 137

## 2010-12-01 LAB — CBC
HCT: 41.2
HCT: 42.1
HCT: 47.1
Hemoglobin: 14.5
Hemoglobin: 16.3
MCHC: 34.5
MCHC: 34.6
MCV: 89.7
MCV: 90.1
MCV: 91.5
Platelets: 195
Platelets: 210
RBC: 4.51
RBC: 4.69
RBC: 5.22
RDW: 13.2
WBC: 8.4
WBC: 9.4

## 2012-02-25 ENCOUNTER — Emergency Department (INDEPENDENT_AMBULATORY_CARE_PROVIDER_SITE_OTHER)
Admission: EM | Admit: 2012-02-25 | Discharge: 2012-02-25 | Disposition: A | Discharge status: Home / Self Care | Payer: Self-pay | Source: Home / Self Care | Attending: Family Medicine | Admitting: Family Medicine

## 2012-02-25 ENCOUNTER — Encounter (HOSPITAL_COMMUNITY): Payer: Self-pay | Admitting: Emergency Medicine

## 2012-02-25 DIAGNOSIS — J111 Influenza due to unidentified influenza virus with other respiratory manifestations: Secondary | ICD-10-CM

## 2012-02-25 HISTORY — DX: Cellulitis, unspecified: L03.90

## 2012-02-25 LAB — POCT RAPID STREP A: Streptococcus, Group A Screen (Direct): NEGATIVE

## 2012-02-25 MED ORDER — PHENYLEPHRINE-GUAIFENESIN 10-400 MG PO TABS: 1.0000 | ORAL_TABLET | Freq: Three times a day (TID) | ORAL | Status: DC | PRN

## 2012-02-25 MED ORDER — IBUPROFEN 600 MG PO TABS: 600.0000 mg | ORAL_TABLET | Freq: Three times a day (TID) | ORAL | Status: DC

## 2012-02-25 MED ORDER — BENZONATATE 100 MG PO CAPS: 100.0000 mg | ORAL_CAPSULE | Freq: Three times a day (TID) | ORAL | Status: DC

## 2012-02-25 MED ORDER — HYDROCODONE-ACETAMINOPHEN 7.5-325 MG/15ML PO SOLN: 15.0000 mL | Freq: Three times a day (TID) | ORAL | Status: DC | PRN

## 2012-02-25 NOTE — ED Provider Notes (Signed)
History     CSN: [id number]  Arrival date & time 02/25/12  1014   First MD Initiated Contact with Patient 02/25/12 1028      Chief Complaint  Patient presents with  . URI    (Consider location/radiation/quality/duration/timing/severity/associated sxs/prior treatment) HPI Comments: 31 year old nonsmoker male with history of obesity otherwise no significant past medical history. Here complaining of nasal congestion, clear rhinorrhea and fever up to 103, sore throat and productive cough with green sputum for 4 days. Denies nausea vomiting or diarrhea. Taking ibuprofen and DayQuil. Last time of DayQuil 3 hours ago. Denies pleuritic type of chest pain. Denies difficulty breathing or shortness of breath. No rash. Appetite is at baseline and is tolerating fluids. No history of diabetes. Patient works as a Naval architect.   Past Medical History  Diagnosis Date  . Cellulitis     History reviewed. No pertinent past surgical history.  No family history on file.  History  Substance Use Topics  . Smoking status: Never Smoker   . Smokeless tobacco: Not on file  . Alcohol Use: No      Review of Systems  Constitutional: Positive for fever. Negative for appetite change and fatigue.  HENT: Positive for congestion, sore throat and rhinorrhea.   Respiratory: Positive for cough. Negative for chest tightness and shortness of breath.   Cardiovascular: Negative for chest pain and leg swelling.  Gastrointestinal: Negative for nausea, vomiting, abdominal pain and diarrhea.  Skin: Negative for rash.  Neurological: Positive for headaches.  All other systems reviewed and are negative.    Allergies  Penicillins  Home Medications   Current Outpatient Rx  Name  Route  Sig  Dispense  Refill  . BENZONATATE 100 MG PO CAPS   Oral   Take 1 capsule (100 mg total) by mouth every 8 (eight) hours.   21 capsule   0   . HYDROCODONE-ACETAMINOPHEN 7.5-325 MG/15ML PO SOLN   Oral   Take 15 mLs by  mouth every 8 (eight) hours as needed for pain (Or cough).   120 mL   0   . IBUPROFEN 600 MG PO TABS   Oral   Take 1 tablet (600 mg total) by mouth 3 (three) times daily.   20 tablet   0   . PHENYLEPHRINE-GUAIFENESIN 10-400 MG PO TABS   Oral   Take 1 tablet by mouth 3 (three) times daily as needed.   12 each   0     BP 132/88  Pulse 114  Temp 100.7 F (38.2 C) (Oral)  Resp 20  SpO2 97%  Physical Exam  Nursing note and vitals reviewed. Constitutional: He is oriented to person, place, and time. He appears well-developed and well-nourished. No distress.  HENT:  Head: Normocephalic and atraumatic.  Right Ear: External ear normal.  Left Ear: External ear normal.       Nasal Congestion with erythema and swelling of nasal turbinates, clear rhinorrhea. pharyngeal erythema no exudates. No uvula deviation. No trismus. TM's with increased vascular markings no dullness no swelling or bulging   Eyes: Conjunctivae normal are normal. Right eye exhibits no discharge. Left eye exhibits no discharge. No scleral icterus.  Neck: Neck supple. No JVD present.  Cardiovascular: Normal heart sounds.  Exam reveals no gallop and no friction rub.   No murmur heard.      Febrile tachycardia  Pulmonary/Chest: Effort normal and breath sounds normal. No respiratory distress. He has no wheezes. He has no rales. He exhibits no tenderness.  Abdominal: Soft.  Lymphadenopathy:    He has no cervical adenopathy.  Neurological: He is alert and oriented to person, place, and time.  Skin: No rash noted. He is not diaphoretic.    ED Course  Procedures (including critical care time)   Labs Reviewed  POCT RAPID STREP A ([facility] ONLY)   No results found.   1. Influenza-like illness       MDM  Impress influenza respiratory infection, clear lungs on examination. Treated with ibuprofen, phenylephrine/guaifenesin, hydrocodone/acetaminophen and Tessalon Perles. Supportive care and red flags that  should prompt his return to medical attention discussed with patient and provided in writing. Patient was encouraged to return if persistent high fever after 5 days from day one. Or return earlier if worsening symptoms like difficulty breathing, chest pain or not keeping fluids down.      Sharin Grave, MD 02/25/12 1524

## 2012-02-25 NOTE — ED Notes (Signed)
Pt c/o cold sx x3/4 days.. Sx include: fevers, cough w/green sputum, sore throat due to cough... Denies: vomiting, nauseas, diarrhea... Took dayquil this am around 07:30... He is alert w/no signs of acute distress.

## 2012-03-03 ENCOUNTER — Encounter (HOSPITAL_BASED_OUTPATIENT_CLINIC_OR_DEPARTMENT_OTHER): Payer: Self-pay

## 2012-03-03 ENCOUNTER — Emergency Department (HOSPITAL_BASED_OUTPATIENT_CLINIC_OR_DEPARTMENT_OTHER): Payer: Self-pay

## 2012-03-03 ENCOUNTER — Emergency Department (HOSPITAL_BASED_OUTPATIENT_CLINIC_OR_DEPARTMENT_OTHER)
Admission: EM | Admit: 2012-03-03 | Discharge: 2012-03-04 | Disposition: A | Discharge status: Home / Self Care | Payer: Self-pay | Attending: Emergency Medicine | Admitting: Emergency Medicine

## 2012-03-03 DIAGNOSIS — J209 Acute bronchitis, unspecified: Secondary | ICD-10-CM | POA: Insufficient documentation

## 2012-03-03 DIAGNOSIS — J40 Bronchitis, not specified as acute or chronic: Secondary | ICD-10-CM

## 2012-03-03 DIAGNOSIS — H9209 Otalgia, unspecified ear: Secondary | ICD-10-CM | POA: Insufficient documentation

## 2012-03-03 DIAGNOSIS — Z872 Personal history of diseases of the skin and subcutaneous tissue: Secondary | ICD-10-CM | POA: Insufficient documentation

## 2012-03-03 DIAGNOSIS — J3489 Other specified disorders of nose and nasal sinuses: Secondary | ICD-10-CM | POA: Insufficient documentation

## 2012-03-03 DIAGNOSIS — M25469 Effusion, unspecified knee: Secondary | ICD-10-CM | POA: Insufficient documentation

## 2012-03-03 DIAGNOSIS — G4730 Sleep apnea, unspecified: Secondary | ICD-10-CM | POA: Insufficient documentation

## 2012-03-03 HISTORY — DX: Sleep apnea, unspecified: G47.30

## 2012-03-03 LAB — CBC WITH DIFFERENTIAL/PLATELET
Basophils Relative: 0 % (ref 0–1)
Eosinophils Absolute: 0.3 10*3/uL (ref 0.0–0.7)
Eosinophils Relative: 2 % (ref 0–5)
HCT: 41.8 % (ref 39.0–52.0)
Hemoglobin: 14.4 g/dL (ref 13.0–17.0)
Lymphocytes Relative: 20 % (ref 12–46)
MCHC: 34.4 g/dL (ref 30.0–36.0)
Neutro Abs: 9.1 10*3/uL — ABNORMAL HIGH (ref 1.7–7.7)
Neutrophils Relative %: 70 % (ref 43–77)
RBC: 4.58 MIL/uL (ref 4.22–5.81)

## 2012-03-03 LAB — BASIC METABOLIC PANEL
CO2: 27 mEq/L (ref 19–32)
Calcium: 9.3 mg/dL (ref 8.4–10.5)
Chloride: 102 mEq/L (ref 96–112)
Glucose, Bld: 111 mg/dL — ABNORMAL HIGH (ref 70–99)
Potassium: 4.1 mEq/L (ref 3.5–5.1)
Sodium: 141 mEq/L (ref 135–145)

## 2012-03-03 MED ORDER — HYDROCODONE-HOMATROPINE 5-1.5 MG/5ML PO SYRP: 5.0000 mL | ORAL_SOLUTION | Freq: Four times a day (QID) | ORAL | Status: DC | PRN

## 2012-03-03 MED ORDER — HYDROCHLOROTHIAZIDE 25 MG PO TABS: 25.0000 mg | ORAL_TABLET | Freq: Every day | ORAL | Status: DC

## 2012-03-03 MED ORDER — ALBUTEROL SULFATE HFA 108 (90 BASE) MCG/ACT IN AERS: 1.0000 | INHALATION_SPRAY | Freq: Four times a day (QID) | RESPIRATORY_TRACT | Status: DC | PRN

## 2012-03-03 MED ORDER — AZITHROMYCIN 250 MG PO TABS: 250.0000 mg | ORAL_TABLET | Freq: Every day | ORAL | Status: DC

## 2012-03-03 MED ORDER — ALBUTEROL SULFATE HFA 108 (90 BASE) MCG/ACT IN AERS
INHALATION_SPRAY | RESPIRATORY_TRACT | Status: AC
  Administered 2012-03-03: 2 via RESPIRATORY_TRACT
  Filled 2012-03-03: qty 6.7

## 2012-03-03 MED ORDER — HYDROCOD POLST-CHLORPHEN POLST 10-8 MG/5ML PO LQCR: 5.0000 mL | Freq: Two times a day (BID) | ORAL | Status: DC

## 2012-03-03 MED ORDER — HYDROCOD POLST-CHLORPHEN POLST 10-8 MG/5ML PO LQCR
5.0000 mL | Freq: Once | ORAL | Status: AC
  Administered 2012-03-03: 5 mL via ORAL
  Filled 2012-03-03: qty 5

## 2012-03-03 MED ORDER — ALBUTEROL SULFATE HFA 108 (90 BASE) MCG/ACT IN AERS
2.0000 | INHALATION_SPRAY | RESPIRATORY_TRACT | Status: DC
  Administered 2012-03-03: 2 via RESPIRATORY_TRACT

## 2012-03-03 NOTE — ED Provider Notes (Signed)
History     CSN: [id number]  Arrival date & time 03/03/12  4098   First MD Initiated Contact with Patient 03/03/12 2033      No chief complaint on file.   (Consider location/radiation/quality/duration/timing/severity/associated sxs/prior treatment) Patient is a 31 y.o. male presenting with cough. The history is provided by the patient. No language interpreter was used.  Cough This is a new problem. The current episode started more than 1 week ago. The problem occurs constantly. The problem has been gradually worsening. The cough is productive of sputum. The maximum temperature recorded prior to his arrival was 100 to 100.9 F. Associated symptoms include ear congestion and ear pain. He has tried an opioid, decongestants and cough syrup for the symptoms. The treatment provided no relief. He is not a smoker. His past medical history does not include pneumonia.  Pt complains of a cough for 10 days.  Pt reports increasing cough and congestion.  Pt complains of ear pain and sinus congestion.  Pt also has swelling in his legs  Past Medical History  Diagnosis Date  . Cellulitis   . Sleep apnea     History reviewed. No pertinent past surgical history.  No family history on file.  History  Substance Use Topics  . Smoking status: Never Smoker   . Smokeless tobacco: Not on file  . Alcohol Use: No      Review of Systems  HENT: Positive for ear pain.   Respiratory: Positive for cough.   All other systems reviewed and are negative.    Allergies  Penicillins  Home Medications   Current Outpatient Rx  Name  Route  Sig  Dispense  Refill  . BENZONATATE 100 MG PO CAPS   Oral   Take 1 capsule (100 mg total) by mouth every 8 (eight) hours.   21 capsule   0   . HYDROCODONE-ACETAMINOPHEN 7.5-325 MG/15ML PO SOLN   Oral   Take 15 mLs by mouth every 8 (eight) hours as needed for pain (Or cough).   120 mL   0   . IBUPROFEN 600 MG PO TABS   Oral   Take 1 tablet (600 mg total) by  mouth 3 (three) times daily.   20 tablet   0   . PHENYLEPHRINE-GUAIFENESIN 10-400 MG PO TABS   Oral   Take 1 tablet by mouth 3 (three) times daily as needed.   12 each   0     BP 179/117  Pulse 99  Temp 99.5 F (37.5 C) (Oral)  Resp 20  Ht 5\' 9"  (1.753 m)  Wt 390 lb (176.903 kg)  BMI 57.59 kg/m2  SpO2 100%  Physical Exam  Nursing note and vitals reviewed. Constitutional: He is oriented to person, place, and time. He appears well-developed and well-nourished.  HENT:  Head: Normocephalic.  Right Ear: External ear normal.  Left Ear: External ear normal.  Nose: Nose normal.  Mouth/Throat: Oropharynx is clear and moist.  Eyes: Conjunctivae normal and EOM are normal. Pupils are equal, round, and reactive to light.  Neck: Normal range of motion. Neck supple.  Cardiovascular: Normal rate.   Pulmonary/Chest: Effort normal and breath sounds normal.       rhonchi  Abdominal: Soft. Bowel sounds are normal.  Musculoskeletal: Normal range of motion.  Neurological: He is alert and oriented to person, place, and time. He has normal reflexes.  Skin: Skin is warm.    ED Course  Procedures (including critical care time)  Labs Reviewed  CBC WITH DIFFERENTIAL - Abnormal; Notable for the following:    WBC 13.0 (*)     Neutro Abs 9.1 (*)     All other components within normal limits  BASIC METABOLIC PANEL - Abnormal; Notable for the following:    Glucose, Bld 111 (*)     All other components within normal limits   Dg Chest 2 View  03/03/2012  *RADIOLOGY REPORT*  Clinical Data: Cough, fever and dizziness.  CHEST - 2 VIEW  Comparison: Chest radiograph performed 10/24/2011  Findings: The lungs are well-aerated and clear.  There is no evidence of focal opacification, pleural effusion or pneumothorax. Minimal medial right basilar density appears to reflect normal vasculature, on correlation with prior studies.  The heart is normal in size; the mediastinal contour is within normal limits.   No acute osseous abnormalities are seen.  IMPRESSION: No acute cardiopulmonary process seen.   Original Report Authenticated By: Tonia Ghent, M.D.      No diagnosis found.    MDM  Pt given rx for tussionex, zithromax and albuterol         Lonia Skinner [place], [place] 03/03/12 2223

## 2012-03-03 NOTE — ED Notes (Signed)
PA at bedside.

## 2012-03-03 NOTE — ED Notes (Signed)
Fever, cough 1 week ag-was seen at [facility]-dx with flu-given hydrocodone cough syrup and tessalon -c/o increase cough

## 2012-03-03 NOTE — ED Notes (Signed)
ED PA at bedside

## 2012-03-06 NOTE — ED Provider Notes (Signed)
Medical screening examination/treatment/procedure(s) were performed by non-physician practitioner and as supervising physician I was immediately available for consultation/collaboration.  Tobin Chad, MD 03/06/12 [phone]

## 2014-03-01 ENCOUNTER — Ambulatory Visit (HOSPITAL_COMMUNITY)
Admission: RE | Admit: 2014-03-01 | Discharge: 2014-03-01 | Disposition: A | Discharge status: Home / Self Care | Payer: BC Managed Care – PPO | Source: Ambulatory Visit | Attending: Emergency Medicine | Admitting: Emergency Medicine

## 2014-03-01 ENCOUNTER — Ambulatory Visit (INDEPENDENT_AMBULATORY_CARE_PROVIDER_SITE_OTHER): Payer: BC Managed Care – PPO | Admitting: Emergency Medicine

## 2014-03-01 ENCOUNTER — Telehealth: Payer: Self-pay

## 2014-03-01 DIAGNOSIS — M79606 Pain in leg, unspecified: Secondary | ICD-10-CM | POA: Diagnosis not present

## 2014-03-01 DIAGNOSIS — R609 Edema, unspecified: Secondary | ICD-10-CM | POA: Diagnosis present

## 2014-03-01 DIAGNOSIS — M7989 Other specified soft tissue disorders: Secondary | ICD-10-CM

## 2014-03-01 LAB — LIPID PANEL
CHOL/HDL RATIO: 6.3 ratio
Cholesterol: 182 mg/dL (ref 0–200)
HDL: 29 mg/dL — ABNORMAL LOW (ref >39)
Triglycerides: 476 mg/dL — ABNORMAL HIGH (ref <150)

## 2014-03-01 LAB — COMPREHENSIVE METABOLIC PANEL
ALBUMIN: 4.3 g/dL (ref 3.5–5.2)
ALT: 78 U/L — AB (ref 0–53)
AST: 73 U/L — ABNORMAL HIGH (ref 0–37)
Alkaline Phosphatase: 84 U/L (ref 39–117)
BUN: 14 mg/dL (ref 6–23)
CALCIUM: 9.6 mg/dL (ref 8.4–10.5)
CO2: 26 mEq/L (ref 19–32)
CREATININE: 0.89 mg/dL (ref 0.50–1.35)
Chloride: 96 mEq/L (ref 96–112)
Glucose, Bld: 348 mg/dL — ABNORMAL HIGH (ref 70–99)
Potassium: 4.7 mEq/L (ref 3.5–5.3)
SODIUM: 136 meq/L (ref 135–145)
TOTAL PROTEIN: 7.6 g/dL (ref 6.0–8.3)
Total Bilirubin: 1.3 mg/dL — ABNORMAL HIGH (ref 0.2–1.2)

## 2014-03-01 LAB — POCT CBC
GRANULOCYTE PERCENT: 70.3 % (ref 37–80)
HCT, POC: 47.6 % (ref 43.5–53.7)
Hemoglobin: 16 g/dL (ref 14.1–18.1)
Lymph, poc: 2.7 (ref 0.6–3.4)
MCH, POC: 30.4 pg (ref 27–31.2)
MCHC: 33.6 g/dL (ref 31.8–35.4)
MCV: 90.3 fL (ref 80–97)
MID (CBC): 0.2 (ref 0–0.9)
MPV: 9.5 fL (ref 0–99.8)
PLATELET COUNT, POC: 237 10*3/uL (ref 142–424)
POC Granulocyte: 6.8 (ref 2–6.9)
POC LYMPH PERCENT: 27.6 %L (ref 10–50)
POC MID %: 2.1 % (ref 0–12)
RBC: 5.27 M/uL (ref 4.69–6.13)
RDW, POC: 13.5 %
WBC: 9.7 10*3/uL (ref 4.6–10.2)

## 2014-03-01 LAB — POCT GLYCOSYLATED HEMOGLOBIN (HGB A1C): Hemoglobin A1C: 11.1

## 2014-03-01 LAB — BRAIN NATRIURETIC PEPTIDE: Brain Natriuretic Peptide: 25.8 pg/mL (ref 0.0–100.0)

## 2014-03-01 LAB — D-DIMER, QUANTITATIVE: D-Dimer, Quant: 0.48 ug/mL-FEU (ref 0.00–0.48)

## 2014-03-01 NOTE — Progress Notes (Signed)
VASCULAR LAB PRELIMINARY  PRELIMINARY  PRELIMINARY  PRELIMINARY  Bilateral lower extremity venous duplexcompleted.    Preliminary report:  No obvious DVT noted in the lower extremities bilaterally.   Report called to Dr. Tinnie GensJeffrey Anderson's  RN.  Carlin Attridge, RVT 03/01/2014, 3:55 PM

## 2014-03-01 NOTE — Progress Notes (Signed)
[facility] [address], [place] [id number] [phone]  Date:  03/01/2014   Name:  Carl GoesMarty C French   DOB:  10/19/1980   MRN:  [id number]  PCP:  No primary care provider on file.    Chief Complaint: Edema and Leg Pain   History of Present Illness:  [name] is a 33 y.o. very pleasant male patient who presents with the following:  Has history of peripheral edema and has been off his diuretics for about a year. Now has increased swelling that is not resolving and does not go away at night. No history of trauma or overuse. No history of anesthesia. 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  Is short of breath usually with exertion. No improvement with over the counter medications or other home remedies.  Denies other complaint or health concern today.   Patient Active Problem List   Diagnosis Date Noted  . Morbid obesity 03/01/2014    Past Medical History  Diagnosis Date  . Cellulitis   . Sleep apnea     History reviewed. No pertinent past surgical history.  History  Substance Use Topics  . Smoking status: Never Smoker   . Smokeless tobacco: Not on file  . Alcohol Use: No    History reviewed. No pertinent family history.  Allergies  Allergen Reactions  . Penicillins Rash    Medication list has been reviewed and updated.  Current Outpatient Prescriptions on File Prior to Visit  Medication Sig Dispense Refill  . azithromycin (ZITHROMAX) 250 MG tablet Take 1 tablet (250 mg total) by mouth daily. Take first 2 tablets together, then 1 every day until finished. 6 tablet 0  . azithromycin (ZITHROMAX) 250 MG tablet Take 1 tablet (250 mg total) by mouth daily. Take first 2 tablets together, then 1 every day until finished. 6 tablet 0  . benzonatate (TESSALON) 100 MG capsule Take 1 capsule (100 mg total) by mouth every 8 (eight)  hours. 21 capsule 0  . chlorpheniramine-HYDROcodone (TUSSIONEX PENNKINETIC ER) 10-8 MG/5ML LQCR Take 5 mLs by mouth every 12 (twelve) hours. 140 mL 0  . hydrochlorothiazide (HYDRODIURIL) 25 MG tablet Take 1 tablet (25 mg total) by mouth daily. 30 tablet 2  . hydrocodone-acetaminophen (HYCET) 7.5-325 MG/15ML solution Take 15 mLs by mouth every 8 (eight) hours as needed for pain (Or cough). 120 mL 0  . HYDROcodone-homatropine (HYCODAN) 5-1.5 MG/5ML syrup Take 5 mLs by mouth every 6 (six) hours as needed for cough. 150 mL 0  . ibuprofen (ADVIL,MOTRIN) 600 MG tablet Take 1 tablet (600 mg total) by mouth 3 (three) times daily. 20 tablet 0  . Phenylephrine-Guaifenesin 10-400 MG TABS Take 1 tablet by mouth 3 (three) times daily as needed. 12 each 0   No current facility-administered medications on file prior to visit.    Review of Systems:  As per HPI, otherwise negative.    Physical Examination: Filed Vitals:   03/01/14 1234  BP: 140/82  Pulse: 105  Temp: 98.9 F (37.2 C)  Resp: 20   Filed Vitals:   03/01/14 1234  Height: 5\' 10"  (1.778 m)  Weight: 414 lb (187.789 kg)   Body mass index is 59.4 kg/(m^2). Ideal Body Weight: Weight in (lb) to have BMI = 25: 173.9  GEN: morbid obesity, NAD, Non-toxic, A & O x 3 HEENT: Atraumatic, Normocephalic. Neck supple. No  masses, No LAD. Ears and Nose: No external deformity. CV: RRR, No M/G/R. No JVD. No thrill. No extra heart sounds. PULM: CTA B, no wheezes, crackles, rhonchi. No retractions. No resp. distress. No accessory muscle use. ABD: S, NT, ND, +BS. No rebound. No HSM. EXTR: No c/c  Marked edema lowers with chronic changes related to venous stasis NEURO Normal gait.  PSYCH: Normally interactive. Conversant. Not depressed or anxious appearing.  Calm demeanor.    Assessment and Plan: Edema Possible DVT Doppler Signed,  Phillips OdorJeffery Anderson, MD

## 2014-03-01 NOTE — Telephone Encounter (Signed)
The patient called regarding lab results (d-dimer) related to possible blood clot in leg.  The labs are back, but not reviewed.  Please review labs and advise patient of results.  The patient may be reached at [phone].

## 2014-03-02 ENCOUNTER — Telehealth: Payer: Self-pay | Admitting: *Deleted

## 2014-03-02 LAB — TSH: TSH: 2.457 u[IU]/mL (ref 0.350–4.500)

## 2014-03-02 NOTE — Telephone Encounter (Signed)
Pt called in regards to labs , looks like they have yet to be reviewed . Please advise.

## 2014-03-02 NOTE — Telephone Encounter (Signed)
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 Have him come in

## 2014-03-03 ENCOUNTER — Ambulatory Visit (INDEPENDENT_AMBULATORY_CARE_PROVIDER_SITE_OTHER): Payer: BC Managed Care – PPO | Admitting: Emergency Medicine

## 2014-03-03 VITALS — BP 148/84 | HR 106 | Temp 99.4°F | Resp 22 | Ht 69.0 in | Wt >= 6400 oz

## 2014-03-03 DIAGNOSIS — R609 Edema, unspecified: Secondary | ICD-10-CM

## 2014-03-03 DIAGNOSIS — E782 Mixed hyperlipidemia: Secondary | ICD-10-CM

## 2014-03-03 DIAGNOSIS — E119 Type 2 diabetes mellitus without complications: Secondary | ICD-10-CM

## 2014-03-03 MED ORDER — FREESTYLE SYSTEM KIT: 1.0000 | PACK | Status: DC | PRN

## 2014-03-03 MED ORDER — ATORVASTATIN CALCIUM 20 MG PO TABS
20.0000 mg | ORAL_TABLET | Freq: Every day | ORAL | Status: DC
Start: 2014-03-03 — End: 2014-10-30

## 2014-03-03 MED ORDER — METFORMIN HCL ER 500 MG PO TB24: ORAL_TABLET | ORAL | Status: DC

## 2014-03-03 MED ORDER — GLIMEPIRIDE 4 MG PO TABS: ORAL_TABLET | ORAL | Status: DC

## 2014-03-03 MED ORDER — LISINOPRIL 10 MG PO TABS
10.0000 mg | ORAL_TABLET | Freq: Every day | ORAL | Status: DC
Start: 2014-03-03 — End: 2014-03-17

## 2014-03-03 NOTE — Patient Instructions (Signed)
Diabetes, Eating Away From Home Sometimes, you might eat in a restaurant or have meals that are prepared by someone else. You can enjoy eating out. However, the portions in restaurants may be much larger than needed. Listed below are some ideas to help you choose foods that will keep your blood glucose (sugar) in better control.  TIPS FOR EATING OUT  Know your meal plan and how many carbohydrate servings you should have at each meal. You may wish to carry a copy of your meal plan in your purse or wallet. Learn the foods included in each food group.  Make a list of restaurants near you that offer healthy choices. Take a copy of the carry-out menus to see what they offer. Then, you can plan what you will order ahead of time.  Become familiar with serving sizes by practicing them at home using measuring cups and spoons. Once you learn to recognize portion sizes, you will be able to correctly estimate the amount of total carbohydrate you are allowed to eat at the restaurant. Ask for a takeout box if the portion is more than you should have. When your food comes, leave the amount you should have on the plate, and put the rest in the takeout box before you start eating.  Plan ahead if your mealtime will be different from usual. Check with your caregiver to find out how to time meals and medicine if you are taking insulin.  Avoid high-fat foods, such as fried foods, cream sauces, high-fat salad dressings, or any added butter or margarine.  Do not be afraid to ask questions. Ask your server about the portion size, cooking methods, ingredients and if items can be substituted. Restaurants do not list all available items on the menu. You can ask for your main entree to be prepared using skim milk, oil instead of butter or margarine, and without gravy or sauces. Ask your waiter or waitress to serve salad dressings, gravy, sauces, margarine, and sour cream on the side. You can then add the amount your meal plan  suggests.  Add more vegetables whenever possible.  Avoid items that are labeled "jumbo," "giant," "deluxe," or "supersized."  You may want to split an entre with someone and order an extra side salad.  Watch for hidden calories in foods like croutons, bacon, or cheese.  Ask your server to take away the bread basket or chips from your table.  Order a dinner salad as an appetizer. You can eat most foods served in a restaurant. Some foods are better choices than others. Breads and Starches  Recommended: All kinds of bread (wheat, rye, white, oatmeal, Italian, French, raisin), hard or soft dinner rolls, frankfurter or hamburger buns, small bagels, small corn or whole-wheat flour tortillas.  Avoid: Frosted or glazed breads, butter rolls, egg or cheese breads, croissants, sweet rolls, pastries, coffee cake, glazed or frosted doughnuts, muffins. Crackers  Recommended: Animal crackers, graham, rye, saltine, oyster, and matzoth crackers. Bread sticks, melba toast, rusks, pretzels, popcorn (without fat), zwieback toast.  Avoid: High-fat snack crackers or chips. Buttered popcorn. Cereals  Recommended: Hot and cold cereals. Whole grains such as oatmeal or shredded wheat are good choices.  Avoid: Sugar-coated or granola type cereals. Potatoes/Pasta/Rice/Beans  Recommended: Order baked, boiled, or mashed potatoes, rice or noodles without added fat, whole beans. Order gravies, butter, margarine, or sauces on the side so you can control the amount you add.  Avoid: Hash browns or fried potatoes. Potatoes, pasta, or rice prepared with cream or   cheese sauce. Potato or pasta salads prepared with large amounts of dressing. Fried beans or fried rice. Vegetables  Recommended: Order steamed, baked, boiled, or stewed vegetables without sauces or extra fat. Ask that sauce be served on the side. If vegetables are not listed on the menu, ask what is available.  Avoid: Vegetables prepared with cream,  butter, or cheese sauce. Fried vegetables. Salad Bars  Recommended: Many of the vegetables at a salad bar are considered "free." Use lemon juice, vinegar, or low-calorie salad dressing (fewer than 20 calories per serving) as "free" dressings for your salad. Look for salad bar ingredients that have no added fat or sugar such as tomatoes, lettuce, cucumbers, broccoli, carrots, onions, and mushrooms.  Avoid: Prepared salads with large amounts of dressing, such as coleslaw, caesar salad, macaroni salad, bean salad, or carrot salad. Fruit  Recommended: Eat fresh fruit or fresh fruit salad without added dressing. A salad bar often offers fresh fruit choices, but canned fruit at a restaurant is usually packed in sugar or syrup.  Avoid: Sweetened canned or frozen fruits, plain or sweetened fruit juice. Fruit salads with dressing, sour cream, or sugar added to them. Meat and Meat Substitutes  Recommended: Order broiled, baked, roasted, or grilled meat, poultry, or fish. Trim off all visible fat. Do not eat the skin of poultry. The size stated on the menu is the raw weight. Meat shrinks by  in cooking (for example, 4 oz raw equals 3 oz cooked meat).  Avoid: Deep-fat fried meat, poultry, or fish. Breaded meats. Eggs  Recommended: Order soft, hard-cooked, poached, or scrambled eggs. Omelets may be okay, depending on what ingredients are added. Egg substitutes are also a good choice.  Avoid: Fried eggs, eggs prepared with cream or cheese sauce. Milk  Recommended: Order low-fat or fat-free milk according to your meal plan. Plain, nonfat yogurt or flavored yogurt with no sugar added may be used as a substitute for milk. Soy milk may also be used.  Avoid: Milk shakes or sweetened milk beverages. Soups and Combination Foods  Recommended: Clear broth or consomm are "free" foods and may be used as an appetizer. Broth-based soups with fat removed count as a starch serving and are preferred over cream  soups. Soups made with beans or split peas may be eaten but count as a starch.  Avoid: Fatty soups, soup made with cream, cheese soup. Combination foods prepared with excessive amounts of fat or with cream or cheese sauces. Desserts and Sweets  Recommended: Ask for fresh fruit. Sponge or angel food cake without icing, ice milk, no sugar added ice cream, sherbet, or frozen yogurt may fit into your meal plan occasionally.  Avoid: Pastries, puddings, pies, cakes with icing, custard, gelatin desserts. Fats and Oils  Recommended: Choose healthy fats such as olive oil, canola oil, or tub margarine, reduced fat or fat-free sour cream, cream cheese, avocado, or nuts.  Avoid: Any fats in excess of your allowed portion. Deep-fried foods or any food with a large amount of fat. Note: Ask for all fats to be served on the side, and limit your portion sizes according to your meal plan. Document Released: 02/19/2005 Document Revised: 05/14/2011 Document Reviewed: 05/19/2013 [facility] Patient Information 2015 [place], [place]. This information is not intended to replace advice given to you by your health care provider. Make sure you discuss any questions you have with your health care provider. Screening for Type 2 Diabetes Screening is a way to check for type 2 diabetes in people who  do not have symptoms of the disease, but who may likely develop diabetes in the future. Diabetes can lead to serious health problems, but finding diabetes early allows for early treatment. DIABETES RISK FACTORS   Family history of diabetes.  Diseases of the pancreas.  Obesity or being overweight.  Certain racial or ethnic groups:  American [place].  Pacific Islander.  Hispanic.  Asian.  African American.  High blood pressure (hypertension).  History of diabetes while pregnant (gestational diabetes).  Delivering a baby that weighed over 9 pounds.  Being inactive.  High cholesterol or triglycerides.  Age,  especially over 32 years of age. WHO IS SCREENED Adults  Adults who have no risk factors and no symptoms should be screened starting at age 32. If the screening tests are normal, they should be repeated every 3 years.  Adults who do not have symptoms, but are overweight, should be screened before age 32.  Adults who do not have symptoms, but have 1 or more risk factors, should be screened.  Adults who have an A1c (3 month average of blood glucose) greater than 5.7% or who had an impaired glucose tolerance (IGT) or impaired fasting glucose (IFG) on a previous test should be screened.  Pregnant women with or without risk factors should be screened.  Women who gave birth and had gestational diabetes should be screened. This testing should be done 6 to 12 weeks after the child is born. Children or Adolescents  Children and adolescents should be screened for type 2 diabetes if they are overweight and have 2 of the following risk factors:  Having a family history of type 2 diabetes.  Being a member of a high risk race or ethnic group.  Having signs of insulin resistance or conditions associated with insulin resistance.  Having a mother who had gestational diabetes while pregnant with him or her.  Screening should start at age 32 or at the onset of puberty, whichever comes first. This should be repeated every 2 years. SCREENING In a screening, your caregiver may:  Ask questions about your overall health. This will include questions about the health of close family members, too.  Ask about any diabetes-like symptoms you may have.  Perform a physical exam.  Order some tests that may include:  A fasting plasma glucose test. This measures the level of glucose in your blood. It is done after you have had nothing to eat but water (fasted) for 8 hours.  A random blood glucose test. This test is done without the need to fast.  An oral glucose tolerance test. This is a blood test done in  2 parts. First, a blood sample is taken after you have fasted. Then, another sample is taken after you drink a liquid that contains a lot of sugar.  An A1c test. This test shows how much glucose has been in your blood over the past 2 to 3 months. Document Released: 12/16/2008 Document Revised: 05/14/2011 Document Reviewed: 09/27/2010 [facility] Patient Information 2015 [place], [place]. This information is not intended to replace advice given to you by your health care provider. Make sure you discuss any questions you have with your health care provider. Diabetes and Exercise Exercising regularly is important. It is not just about losing weight. It has many health benefits, such as:  Improving your overall fitness, flexibility, and endurance.  Increasing your bone density.  Helping with weight control.  Decreasing your body fat.  Increasing your muscle strength.  Reducing stress and tension.  Improving  your overall health. People with diabetes who exercise gain additional benefits because exercise:  Reduces appetite.  Improves the body's use of blood sugar (glucose).  Helps lower or control blood glucose.  Decreases blood pressure.  Helps control blood lipids (such as cholesterol and triglycerides).  Improves the body's use of the hormone insulin by:  Increasing the body's insulin sensitivity.  Reducing the body's insulin needs.  Decreases the risk for heart disease because exercising:  Lowers cholesterol and triglycerides levels.  Increases the levels of good cholesterol (such as high-density lipoproteins [HDL]) in the body.  Lowers blood glucose levels. YOUR ACTIVITY PLAN  Choose an activity that you enjoy and set realistic goals. Your health care provider or diabetes educator can help you make an activity plan that works for you. Exercise regularly as directed by your health care provider. This includes:  Performing resistance training twice a week such as push-ups,  sit-ups, lifting weights, or using resistance bands.  Performing 150 minutes of cardio exercises each week such as walking, running, or playing sports.  Staying active and spending no more than 90 minutes at one time being inactive. Even short bursts of exercise are good for you. Three 10-minute sessions spread throughout the day are just as beneficial as a single 30-minute session. Some exercise ideas include:  Taking the dog for a walk.  Taking the stairs instead of the elevator.  Dancing to your favorite song.  Doing an exercise video.  Doing your favorite exercise with a friend. RECOMMENDATIONS FOR EXERCISING WITH TYPE 1 OR TYPE 2 DIABETES   Check your blood glucose before exercising. If blood glucose levels are greater than 240 mg/dL, check for urine ketones. Do not exercise if ketones are present.  Avoid injecting insulin into areas of the body that are going to be exercised. For example, avoid injecting insulin into:  The arms when playing tennis.  The legs when jogging.  Keep a record of:  Food intake before and after you exercise.  Expected peak times of insulin action.  Blood glucose levels before and after you exercise.  The type and amount of exercise you have done.  Review your records with your health care provider. Your health care provider will help you to develop guidelines for adjusting food intake and insulin amounts before and after exercising.  If you take insulin or oral hypoglycemic agents, watch for signs and symptoms of hypoglycemia. They include:  Dizziness.  Shaking.  Sweating.  Chills.  Confusion.  Drink plenty of water while you exercise to prevent dehydration or heat stroke. Body water is lost during exercise and must be replaced.  Talk to your health care provider before starting an exercise program to make sure it is safe for you. Remember, almost any type of activity is better than none. Document Released: 05/12/2003 Document  Revised: 07/06/2013 Document Reviewed: 07/29/2012 [facility] Patient Information 2015 [place], [place]. This information is not intended to replace advice given to you by your health care provider. Make sure you discuss any questions you have with your health care provider. Type 2 Diabetes Mellitus Type 2 diabetes mellitus, often simply referred to as type 2 diabetes, is a long-lasting (chronic) disease. In type 2 diabetes, the pancreas does not make enough insulin (a hormone), the cells are less responsive to the insulin that is made (insulin resistance), or both. Normally, insulin moves sugars from food into the tissue cells. The tissue cells use the sugars for energy. The lack of insulin or the lack of normal  response to insulin causes excess sugars to build up in the blood instead of going into the tissue cells. As a result, high blood sugar (hyperglycemia) develops. The effect of high sugar (glucose) levels can cause many complications. Type 2 diabetes was also previously called adult-onset diabetes, but it can occur at any age.  RISK FACTORS  A person is predisposed to developing type 2 diabetes if someone in the family has the disease and also has one or more of the following primary risk factors:  Overweight.  An inactive lifestyle.  A history of consistently eating high-calorie foods. Maintaining a normal weight and regular physical activity can reduce the chance of developing type 2 diabetes. SYMPTOMS  A person with type 2 diabetes may not show symptoms initially. The symptoms of type 2 diabetes appear slowly. The symptoms include:  Increased thirst (polydipsia).  Increased urination (polyuria).  Increased urination during the night (nocturia).  Weight loss. This weight loss may be rapid.  Frequent, recurring infections.  Tiredness (fatigue).  Weakness.  Vision changes, such as blurred vision.  Fruity smell to your breath.  Abdominal pain.  Nausea or vomiting.  Cuts  or bruises which are slow to heal.  Tingling or numbness in the hands or feet. DIAGNOSIS Type 2 diabetes is frequently not diagnosed until complications of diabetes are present. Type 2 diabetes is diagnosed when symptoms or complications are present and when blood glucose levels are increased. Your blood glucose level may be checked by one or more of the following blood tests:  A fasting blood glucose test. You will not be allowed to eat for at least 8 hours before a blood sample is taken.  A random blood glucose test. Your blood glucose is checked at any time of the day regardless of when you ate.  A hemoglobin A1c blood glucose test. A hemoglobin A1c test provides information about blood glucose control over the previous 3 months.  An oral glucose tolerance test (OGTT). Your blood glucose is measured after you have not eaten (fasted) for 2 hours and then after you drink a glucose-containing beverage. TREATMENT   You may need to take insulin or diabetes medicine daily to keep blood glucose levels in the desired range.  If you use insulin, you may need to adjust the dosage depending on the carbohydrates that you eat with each meal or snack. The treatment goal is to maintain the before meal blood sugar (preprandial glucose) level at 70-130 mg/dL. HOME CARE INSTRUCTIONS   Have your hemoglobin A1c level checked twice a year.  Perform daily blood glucose monitoring as directed by your health care provider.  Monitor urine ketones when you are ill and as directed by your health care provider.  Take your diabetes medicine or insulin as directed by your health care provider to maintain your blood glucose levels in the desired range.  Never run out of diabetes medicine or insulin. It is needed every day.  If you are using insulin, you may need to adjust the amount of insulin given based on your intake of carbohydrates. Carbohydrates can raise blood glucose levels but need to be included in your  diet. Carbohydrates provide vitamins, minerals, and fiber which are an essential part of a healthy diet. Carbohydrates are found in fruits, vegetables, whole grains, dairy products, legumes, and foods containing added sugars.  Eat healthy foods. You should make an appointment to see a registered dietitian to help you create an eating plan that is right for you.  Lose weight  if you are overweight.  Carry a medical alert card or wear your medical alert jewelry.  Carry a 15-gram carbohydrate snack with you at all times to treat low blood glucose (hypoglycemia). Some examples of 15-gram carbohydrate snacks include:  Glucose tablets, 3 or 4.  Glucose gel, 15-gram tube.  Raisins, 2 tablespoons (24 grams).  Jelly beans, 6.  Animal crackers, 8.  Regular pop, 4 ounces (120 mL).  Gummy treats, 9.  Recognize hypoglycemia. Hypoglycemia occurs with blood glucose levels of 70 mg/dL and below. The risk for hypoglycemia increases when fasting or skipping meals, during or after intense exercise, and during sleep. Hypoglycemia symptoms can include:  Tremors or shakes.  Decreased ability to concentrate.  Sweating.  Increased heart rate.  Headache.  Dry mouth.  Hunger.  Irritability.  Anxiety.  Restless sleep.  Altered speech or coordination.  Confusion.  Treat hypoglycemia promptly. If you are alert and able to safely swallow, follow the 15:15 rule:  Take 15-20 grams of rapid-acting glucose or carbohydrate. Rapid-acting options include glucose gel, glucose tablets, or 4 ounces (120 mL) of fruit juice, regular soda, or low-fat milk.  Check your blood glucose level 15 minutes after taking the glucose.  Take 15-20 grams more of glucose if the repeat blood glucose level is still 70 mg/dL or below.  Eat a meal or snack within 1 hour once blood glucose levels return to normal.  Be alert to feeling very thirsty and urinating more frequently than usual, which are early signs of  hyperglycemia. An early awareness of hyperglycemia allows for prompt treatment. Treat hyperglycemia as directed by your health care provider.  Engage in at least 150 minutes of moderate-intensity physical activity a week, spread over at least 3 days of the week or as directed by your health care provider. In addition, you should engage in resistance exercise at least 2 times a week or as directed by your health care provider. Try to spend no more than 90 minutes at one time inactive.  Adjust your medicine and food intake as needed if you start a new exercise or sport.  Follow your sick-day plan anytime you are unable to eat or drink as usual.  Do not use any tobacco products including cigarettes, chewing tobacco, or electronic cigarettes. If you need help quitting, ask your health care provider.  Limit alcohol intake to no more than 1 drink per day for nonpregnant women and 2 drinks per day for men. You should drink alcohol only when you are also eating food. Talk with your health care provider whether alcohol is safe for you. Tell your health care provider if you drink alcohol several times a week.  Keep all follow-up visits as directed by your health care provider. This is important.  Schedule an eye exam soon after the diagnosis of type 2 diabetes and then annually.  Perform daily skin and foot care. Examine your skin and feet daily for cuts, bruises, redness, nail problems, bleeding, blisters, or sores. A foot exam by a health care provider should be done annually.  Brush your teeth and gums at least twice a day and floss at least once a day. Follow up with your dentist regularly.  Share your diabetes management plan with your workplace or school.  Stay up-to-date with immunizations. It is recommended that people with diabetes who are over 24 years old get the pneumonia vaccine. In some cases, two separate shots may be given. Ask your health care provider if your pneumonia vaccination is  up-to-date.  Learn to manage stress.  Obtain ongoing diabetes education and support as needed.  Participate in or seek rehabilitation as needed to maintain or improve independence and quality of life. Request a physical or occupational therapy referral if you are having foot or hand numbness, or difficulties with grooming, dressing, eating, or physical activity. SEEK MEDICAL CARE IF:   You are unable to eat food or drink fluids for more than 6 hours.  You have nausea and vomiting for more than 6 hours.  Your blood glucose level is over 240 mg/dL.  There is a change in mental status.  You develop an additional serious illness.  You have diarrhea for more than 6 hours.  You have been sick or have had a fever for a couple of days and are not getting better.  You have pain during any physical activity.  SEEK IMMEDIATE MEDICAL CARE IF:  You have difficulty breathing.  You have moderate to large ketone levels. MAKE SURE YOU:  Understand these instructions.  Will watch your condition.  Will get help right away if you are not doing well or get worse. Document Released: 02/19/2005 Document Revised: 07/06/2013 Document Reviewed: 09/18/2011 [facility] Patient Information 2015 [place], [place]. This information is not intended to replace advice given to you by your health care provider. Make sure you discuss any questions you have with your health care provider.

## 2014-03-03 NOTE — Telephone Encounter (Signed)
Gave patient wrong information regarding Dr. Dareen PianoAnderson schedule. Called him to let him know Dr. Dareen PianoAnderson is in office today until close (8:30). Per Dr. Dareen PianoAnderson he will be happy to f/u with him or he could see any provider here if he is not here. As far as how long he can not drive it depends on medication and glucose readings. Patient notified and voiced understanding

## 2014-03-03 NOTE — Progress Notes (Signed)
[facility] [address], [place] [id number] [phone]  Date:  03/03/2014   Name:  Carl GoesMarty C French   DOB:  02/10/1981   MRN:  [id number]  PCP:  No PCP Per Patient    Chief Complaint: Obesity and Follow-up   History of Present Illness:  [name] is a 33 y.o. very pleasant male patient who presents with the following:  Tested last week and had a sugar of 348 A1C of 11.1. He is asymptomatic Has history of prior elevated sugars in past three years. No improvement with over the counter medications or other home remedies.  Denies other complaint or health concern today.   Patient Active Problem List   Diagnosis Date Noted  . Morbid obesity 03/01/2014    Past Medical History  Diagnosis Date  . Cellulitis   . Sleep apnea     No past surgical history on file.  History  Substance Use Topics  . Smoking status: Never Smoker   . Smokeless tobacco: Not on file  . Alcohol Use: No    Family History  Problem Relation Age of Onset  . Diabetes Mother   . Diabetes Father     Allergies  Allergen Reactions  . Penicillins Rash    Medication list has been reviewed and updated.  Current Outpatient Prescriptions on File Prior to Visit  Medication Sig Dispense Refill  . hydrochlorothiazide (HYDRODIURIL) 25 MG tablet Take 1 tablet (25 mg total) by mouth daily. (Patient not taking: Reported on 03/03/2014) 30 tablet 2  . ibuprofen (ADVIL,MOTRIN) 600 MG tablet Take 1 tablet (600 mg total) by mouth 3 (three) times daily. (Patient not taking: Reported on 03/03/2014) 20 tablet 0   No current facility-administered medications on file prior to visit.    Review of Systems:  As per HPI, otherwise negative.    Physical Examination: Filed Vitals:   03/03/14 1825  BP: 148/84  Pulse: 106  Temp: 99.4 F (37.4 C)  Resp: 22   Filed Vitals:   03/03/14 1825  Height: 5\' 9"  (1.753 m)  Weight: 436 lb (197.768 kg)   Body mass index is 64.36  kg/(m^2). Ideal Body Weight: Weight in (lb) to have BMI = 25: 168.9   GEN: morbid obesity, NAD, Non-toxic, Alert & Oriented x 3 HEENT: Atraumatic, Normocephalic.  Ears and Nose: No external deformity. EXTR: No clubbing/cyanosis/edema NEURO: Normal gait.  PSYCH: Normally interactive. Conversant. Not depressed or anxious appearing.  Calm demeanor.    Assessment and Plan: NIDDM HLD Start meds:  Lisinopril, metformin and glimiperide Follow up in one week   Signed,  Phillips OdorJeffery Latima Hamza, MD

## 2014-03-03 NOTE — Telephone Encounter (Signed)
Patient notified of lab results and voiced understanding. He did have a couple questions. Those were on different phone message 03/02/2014 and sent to Dr. Dareen PianoAnderson. See message

## 2014-03-03 NOTE — Telephone Encounter (Signed)
Lab results reviewed with patient. See lab result. Since you said he can not drive until sugar is under control he wanted to know if that means he needs to be out of work for the next couple/few days, once he starts medications, or would it need to be longer? Also he is wanting to know if he should come in today and see another provider here or should he wait until tomorrow and f/u with you? Please advise

## 2014-03-10 ENCOUNTER — Ambulatory Visit (INDEPENDENT_AMBULATORY_CARE_PROVIDER_SITE_OTHER): Payer: BLUE CROSS/BLUE SHIELD | Admitting: Internal Medicine

## 2014-03-10 ENCOUNTER — Encounter: Payer: Self-pay | Admitting: Family Medicine

## 2014-03-10 DIAGNOSIS — IMO0002 Reserved for concepts with insufficient information to code with codable children: Secondary | ICD-10-CM

## 2014-03-10 DIAGNOSIS — Z9989 Dependence on other enabling machines and devices: Secondary | ICD-10-CM | POA: Insufficient documentation

## 2014-03-10 DIAGNOSIS — E1165 Type 2 diabetes mellitus with hyperglycemia: Secondary | ICD-10-CM

## 2014-03-10 DIAGNOSIS — E119 Type 2 diabetes mellitus without complications: Secondary | ICD-10-CM

## 2014-03-10 DIAGNOSIS — G4733 Obstructive sleep apnea (adult) (pediatric): Secondary | ICD-10-CM

## 2014-03-10 NOTE — Patient Instructions (Addendum)
Keep up the good work with your healthy eating choices- do not have regular soda, juice, sweet tea. Avoid starchy foods- breads, pasta, potatoes, sweets, rice, corn, peas/beans. Eat lots of vegetables- cooked and uncooked, 1-2 small servings of fruit a day (example- 1 small apple, 1 orange, 1/2 banana) and lean proteins (tuna, chicken, beef, pork, fish, eggs).   Add 81 mg aspirin daily.

## 2014-03-10 NOTE — Progress Notes (Signed)
Subjective:    Patient ID: [name], male    DOB: 1981-02-17, 34 y.o.   MRN: [id number]  HPI This is a very pleasant 34 yo male who is accompanied by his brother in law [name], who is a Engineer, civil (consulting). The patient presents today for follow up of recent visit at [facility]. He was diagnosed with diabetes (hgb A1C 11) and was started on Glucophage  XR 1000 mg po qd, glipizide 4 mg po qd, atorvastatin 20 mg and lisinopril  . He is to double his Glucophage and glipizide after 1 week (which will be tomorrow).  He is checking his blood sugar. His fasting readings have gone from 400s to 200. His random readings are in the 140-180s.  He denies GI upset, headaches, dizziness. At the beginning of last week, he had 1 episode of light headedness that lasted for a few seconds. He has not had any further episodes.  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 He is currently out of work until his medication and blood sugars are stabilized. He is concerned about following an eating plan while on the road.  He has OSA and wears his CPAP consistently while on the road, he is not as consistent when he is at home.   He has a history of pedal edema, lower extremity cellulitis. He reports the swelling in his legs has decreased over the last week and the discoloration is improved.  Past Medical History  Diagnosis Date  . Cellulitis   . Sleep apnea    No past surgical history on file. Family History  Problem Relation Age of Onset  . Diabetes Mother   . Diabetes Father    History  Substance Use Topics  . Smoking status: Never Smoker   . Smokeless tobacco: Not on file  . Alcohol Use: No     Review of Systems No chest pain, no SOB, no cough, no headache, no dizziness, no calf pain    Objective:   Physical Exam  Constitutional: He is oriented to person, place, and time. He appears well-developed and well-nourished. No distress.  Morbidly obese.    HENT:  Head: Normocephalic and atraumatic.  Right Ear: External ear normal.  Left Ear: External ear normal.  Eyes: Conjunctivae are normal.  Neck: Normal range of motion. Neck supple.  Cardiovascular: Normal rate.   Pulmonary/Chest: Effort normal.  Musculoskeletal: He exhibits edema (bilaterl LE +1).  Neurological: He is alert and oriented to person, place, and time.  Skin: Skin is warm and dry. He is not diaphoretic.  Discoloration and chronic skin changes noted.   Psychiatric: He has a normal mood and affect. His behavior is normal. Judgment and thought content normal.  Vitals reviewed. BP 122/74 mmHg  Pulse 102  Temp(Src) 98.9 F (37.2 C) (Oral)  Resp 18  Ht  (1.753 m)  Wt 422 lb 9.6 oz (191.69 kg)  BMI 62.38 kg/m2  SpO2 94%    Assessment & Plan:  Discussed continued driving restrictions with Dr. Merla Riches 1. Diabetes mellitus type 2, uncontrolled -- Ambulatory referral to diabetic education- called and requested that patient get worked in ASAP if possible while he is out of work. -out of work until further notice -increase Glucophage and glipizide tomorrow as planned and follow up in 1 week- he is to bring log of his blood sugars.  -Discussed eating plan and ways to eat well on the road -  Encouraged increased activity- using pedometer and increasing steps. -Add aspirin 81 mg daily -He is slightly overdue on eye exam and will go see his regular provider.  2. OSA on CPAP -encouraged 100% compliance  3. Morbid Obesity -Celebrated patient's 14 pound weight loss and encouraged continued healthy choices -Encouraged him to consider gastric bypass surgery  Carl Belfasteborah B. Engelbert Sevin, FNP-BC  [facility], [facility]  03/10/2014 3:39 PM   I have participated in the care of this patient with the Advanced Practice Provider and agree with Diagnosis and Plan as documented. Robert P. Merla Richesoolittle, M.D.

## 2014-03-17 ENCOUNTER — Encounter: Payer: Self-pay | Admitting: Family Medicine

## 2014-03-17 ENCOUNTER — Ambulatory Visit (INDEPENDENT_AMBULATORY_CARE_PROVIDER_SITE_OTHER): Payer: BLUE CROSS/BLUE SHIELD | Admitting: Family Medicine

## 2014-03-17 ENCOUNTER — Encounter: Payer: BLUE CROSS/BLUE SHIELD | Attending: Internal Medicine | Admitting: *Deleted

## 2014-03-17 ENCOUNTER — Encounter: Payer: Self-pay | Admitting: *Deleted

## 2014-03-17 VITALS — BP 100/80 | HR 89 | Temp 98.0°F | Resp 16 | Ht 69.25 in | Wt >= 6400 oz

## 2014-03-17 VITALS — Ht 69.5 in | Wt >= 6400 oz

## 2014-03-17 DIAGNOSIS — E1165 Type 2 diabetes mellitus with hyperglycemia: Secondary | ICD-10-CM

## 2014-03-17 DIAGNOSIS — IMO0002 Reserved for concepts with insufficient information to code with codable children: Secondary | ICD-10-CM

## 2014-03-17 DIAGNOSIS — Z713 Dietary counseling and surveillance: Secondary | ICD-10-CM | POA: Insufficient documentation

## 2014-03-17 DIAGNOSIS — R609 Edema, unspecified: Secondary | ICD-10-CM

## 2014-03-17 MED ORDER — GLIMEPIRIDE 4 MG PO TABS: 4.0000 mg | ORAL_TABLET | Freq: Every day | ORAL | Status: DC

## 2014-03-17 MED ORDER — LISINOPRIL 10 MG PO TABS
10.0000 mg | ORAL_TABLET | Freq: Every day | ORAL | Status: DC
Start: 2014-03-17 — End: 2015-05-18

## 2014-03-17 MED ORDER — GLUCOSE BLOOD VI STRP: ORAL_STRIP | Status: DC

## 2014-03-17 MED ORDER — METFORMIN HCL ER (OSM) 1000 MG PO TB24: 2000.0000 mg | ORAL_TABLET | Freq: Every day | ORAL | Status: DC

## 2014-03-17 NOTE — Progress Notes (Signed)
Subjective:    Patient ID: [name], male    DOB: Nov 21, 1980, 34 y.o.   MRN: [id number]  HPI This is a very pleasant 34 yo male. He is accompanied by his brother in law who is a Therapist, sports with [employer]. The patient presents today for follow up of newly diagnosed DM2. He has been checking his blood sugars at home. Fasting glucoses have been around 100 with one reading at 170 after eating flat bread the night before. Post prandial 170s. He has noticed a dramatic decline from the 300s when he first started taking his medication and watching his diet. He is currently taking Glucophage XR 2000 mg po q am and Amaryl 8 mg po q am. Patient increased his doses last week; he denies side effects from increasing medications.   Patient denies dizziness, need for any assistance, headaches, nausea, vomiting, diarrhea, abdominal pain. He reports that his vision seems improved with improved glucose control. He also reports that his lower extremity edema and skin color have improved quite a bit. His brother in law concurs. He is planning on see his optometrist when he returns from his next trip. It has been a little over a year since his last dilated eye exam.   The patient met with diabetes nutrition earlier today. He reports that he found the information very educational and feels that he has adequate information to help him make sensible food choices while he is on the road. He feels ready to return to work. When he is not driving, he lives with his sister and brother in Sports coach. They are currently doing Weight Watchers and patient feels supported in his home environment.    Review of Systems  Respiratory: Negative for shortness of breath.   Cardiovascular: Positive for leg swelling (chronic). Negative for chest pain.  Gastrointestinal: Negative for nausea, vomiting, abdominal pain, diarrhea and constipation.  Neurological: Negative for weakness, light-headedness and headaches.      Objective:   Physical Exam    Constitutional: He is oriented to person, place, and time. He appears well-developed and well-nourished. No distress.  Morbidly obese.   HENT:  Head: Normocephalic and atraumatic.  Right Ear: External ear normal.  Left Ear: External ear normal.  Nose: Nose normal.  Eyes: Conjunctivae are normal.  Neck: Normal range of motion. Neck supple.  Cardiovascular: Normal rate.   Pulmonary/Chest: Effort normal.  Musculoskeletal: He exhibits edema (plus 2 pretibial, right calf 43 cm, left calf 51 cm).  Neurological: He is alert and oriented to person, place, and time.  Skin: Skin is warm and dry. He is not diaphoretic.  Psychiatric: He has a normal mood and affect. His behavior is normal. Judgment and thought content normal.  Vitals reviewed. BP 100/80 mmHg  Pulse 89  Temp(Src) 98 F (36.7 C) (Oral)  Resp 16  Ht 5' 9.25" (1.759 m)  Wt 416 lb (188.696 kg)  BMI 60.99 kg/m2  SpO2 97% Weights- 03/03/14- 436 03/10/14- 422 03/17/14- 416    Assessment & Plan:  Discussed with Dr.Doolittle 1. Diabetes mellitus type 2, uncontrolled - metformin (FORTAMET) 1000 MG (OSM) 24 hr tablet; Take 2 tablets (2,000 mg total) by mouth daily with breakfast.  Dispense: 180 tablet; Refill: 3 - lisinopril (PRINIVIL,ZESTRIL) 10 MG tablet; Take 1 tablet (10 mg total) by mouth daily.  Dispense: 90 tablet; Refill: 3 - glimepiride (AMARYL) 4 MG tablet; Take 1 tablet (4 mg total) by mouth daily with breakfast.  Dispense: 90 tablet; Refill: 3 - Due  to risk of hypoglycemia, will have patient take glimepiride 4 mg daily instead of 8 mg -Cleared patient to return to work next week. Reviewed symptoms of hypoglycemia and importance of adherence to medication regimen and monitoring of blood sugar.  2. Morbid obesity -Celebrated patient's 20 pound weight loss and encouraged continued good food choices as well as gradual increase of physical activity- start slow with 5-10 minutes of walking and stretching. -Discussed  bariatric surgery and patient has decided to try to loose weight on his own for one year, then to consider referral.   3. Peripheral edema -This is improving, will continue to monitor with improved glucose control and weight loss. Discussed use of compression socks.   Elby Beck, FNP-BC  [facility], [facility]  03/18/2014 6:05 PM

## 2014-03-17 NOTE — Progress Notes (Signed)
Diabetes Self-Management Education   Appt. Start Time: 0800 Appt. End Time: 0930  03/18/2014  [name], identified by name and date of birth, is a 34 y.o. male with a diagnosis of Diabetes: Type 2.  Other people present during visit:  Patient . [name] is a very pleasant man presenting with a new diagnosis of T2DM. 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  ASSESSMENT  Height 5' 9.5" (1.765 m), weight 418 lb 14.4 oz (190.012 kg). Body mass index is 60.99 kg/(m^2).  Initial Visit Information:  Are you currently following a meal plan?: No Are you taking your medications as prescribed?: Yes Are you checking your feet?: No How often do you need to have someone help you when you read instructions, pamphlets, or other written materials from your doctor or pharmacy?: 1 - Never   Psychosocial:   Patient Belief/Attitude about Diabetes: Motivated to manage diabetes Self-management support: Doctor's office, CDE visits, Family (stays with sister when not on the road working) Other persons present: Patient Patient Concerns: Nutrition/Meal planning, Glycemic Control, Weight Control Preferred Learning Style: No preference indicated Learning Readiness: Change in progress  Complications:   Last HgB A1C per patient/outside source: 11.1 mg/dL, DX 95/284112/2015 How often do you check your blood sugar?: 3-4 times/day Fasting Blood glucose range (mg/dL): [phone] Postprandial Blood glucose range (mg/dL): [phone] Have you had a dilated eye exam in the past 12 months?: No Have you had a dental exam in the past 12 months?: No  Diet Intake:  Breakfast: fruit, cottage cheese, egg whites, water,  Snack (morning): none Lunch: salade (greens, vegets, chicken/turkey) Snack (afternoon): none Dinner: rice, beans, chicken Snack (evening): none Beverage(s): water, sweet tea(stevia), coffee creamer)  Exercise:  Exercise:  ADL's  Individualized Plan for Diabetes Self-Management Training:   Learning Objective:  Patient will have a greater understanding of diabetes self-management. Patient education plan per assessed needs and concerns is to attend individual sessions     Education Topics Reviewed with Patient Today:  Definition of diabetes, type 1 and 2, and the diagnosis of diabetes, Factors that contribute to the development of diabetes Role of diet in the treatment of diabetes and the relationship between the three main macronutrients and blood glucose level, Carbohydrate counting, Meal options for control of blood glucose level and chronic complications. Taught/evaluated SMBG meter., Daily foot exams, Yearly dilated eye exam  Relationship between chronic complications and blood glucose control  Lifestyle issues that need to be addressed for better diabetes care (Over the road truck driver, eating fast food)  PATIENTS GOALS/Plan (Developed by the patient):  Nutrition: General guidelines for healthy choices and portions discussed Medications: take my medication as prescribed Monitoring : test my blood glucose as discussed (note x per day with comment) (test glucose FBS and 2hpp dinner and log in book)  Patient Instructions  Plan:  Aim for 3-4 Carb Choices per meal (45-60 grams) +/- 1 either way  Aim for 0-15 Carbs per snack if hungry  Include protein in moderation with your meals and snacks Consider reading food labels for Total Carbohydrate and Fat Grams of foods Consider  increasing your activity level by walking for 30 minutes daily as tolerated Consider checking BG at alternate times per day to include fasting and two hours after dinner  And log in your book continue taking medication as directed by MD  Always have protein and carbohydrate with every meal  See your physician  today. Determine when you will return to work. I would like to see you back in about 2 weeks if you haven't gone back to  work.  Expected Outcomes:  Demonstrated interest in learning. Expect positive outcomes  Education material provided: Living Well with Diabetes, A1C conversion sheet, Meal plan card, My Plate and Snack sheet  If problems or questions, patient to contact team via:  Phone  Future DSME appointment: 2 wks (return visit will depend on work schedule)

## 2014-03-17 NOTE — Patient Instructions (Addendum)
Take amaryl 4 mg every morning (not 8 mg) Keep up the good work with your diet!

## 2014-03-17 NOTE — Patient Instructions (Addendum)
Plan:  Aim for 3-4 Carb Choices per meal (45-60 grams) +/- 1 either way  Aim for 0-15 Carbs per snack if hungry  Include protein in moderation with your meals and snacks Consider reading food labels for Total Carbohydrate and Fat Grams of foods Consider  increasing your activity level by walking for 30 minutes daily as tolerated Consider checking BG at alternate times per day to include fasting and two hours after dinner  And log in your book continue taking medication as directed by MD  Always have protein and carbohydrate with every meal  See your physician today. Determine when you will return to work. I would like to see you back in about 2 weeks if you haven't gone back to work.

## 2014-03-18 ENCOUNTER — Telehealth: Payer: Self-pay | Admitting: Family Medicine

## 2014-03-18 ENCOUNTER — Other Ambulatory Visit: Payer: Self-pay

## 2014-03-18 MED ORDER — BLOOD GLUCOSE MONITORING SUPPL KIT: PACK | Status: DC

## 2014-03-18 MED ORDER — GLUCOSE BLOOD VI STRP: ORAL_STRIP | Status: DC

## 2014-03-18 MED ORDER — LANCETS MISC: Status: DC

## 2014-03-18 NOTE — Telephone Encounter (Signed)
Called patient to let him know that I changed his Glucophage to 1000 mg strength and that he would take 2 tabs every morning instead of 4. He reports that he is having trouble getting his test strips covered by his insurance. I called his pharmacy and was told that his insurance covers one type of meter, but a different brand of strips. They have sent in a prior authorization request to get the strips he needs. I suggested to the patient that he call his insurance company as well.

## 2014-03-19 ENCOUNTER — Encounter: Payer: Self-pay | Admitting: Family Medicine

## 2014-03-19 ENCOUNTER — Telehealth: Payer: Self-pay

## 2014-03-19 NOTE — Telephone Encounter (Signed)
Spoke with pt. He was just prescribed XR and it is very expensive. Wants to know if we can change his rx. He only has enough tabs to last until Mon and Eunice BlaseDebbie is out of the office. Can we change for him. Wants to continue taking qd if possible. Thanks

## 2014-03-19 NOTE — Telephone Encounter (Signed)
Pt called back wanting to know when would get back to him. Please advise at [phone]

## 2014-03-19 NOTE — Telephone Encounter (Signed)
I called pharm about PA and they reported that the Accu-check Aviva strips were covered and are ready for pt, does not require a PA

## 2014-03-19 NOTE — Telephone Encounter (Signed)
Pt called wanting a refill on his metformin (FORTAMET) 1000 MG (OSM) 24 hr tablet [[id number]. He was talking about a time release? Please advise at [phone]

## 2014-03-20 MED ORDER — METFORMIN HCL 1000 MG PO TABS: 1000.0000 mg | ORAL_TABLET | Freq: Two times a day (BID) | ORAL | Status: DC

## 2014-03-20 NOTE — Telephone Encounter (Signed)
BID. Thanks.

## 2014-03-20 NOTE — Telephone Encounter (Signed)
If he wants to take the medication once a day he has to take the extended release which is a little more expensive.  If he wants cheaper medication he will have to take bid.  Sorry.  Please ask the patient which he would prefer.

## 2014-05-12 ENCOUNTER — Ambulatory Visit: Payer: BLUE CROSS/BLUE SHIELD | Admitting: Family Medicine

## 2014-08-30 ENCOUNTER — Other Ambulatory Visit: Payer: Self-pay

## 2014-10-12 ENCOUNTER — Encounter: Payer: Self-pay | Admitting: *Deleted

## 2014-10-30 ENCOUNTER — Other Ambulatory Visit: Payer: Self-pay | Admitting: Emergency Medicine

## 2015-01-31 ENCOUNTER — Encounter: Payer: Self-pay | Admitting: Internal Medicine

## 2015-02-28 ENCOUNTER — Other Ambulatory Visit: Payer: Self-pay | Admitting: Physician Assistant

## 2015-05-18 ENCOUNTER — Ambulatory Visit (INDEPENDENT_AMBULATORY_CARE_PROVIDER_SITE_OTHER): Payer: Self-pay | Admitting: Family Medicine

## 2015-05-18 VITALS — BP 138/90 | HR 100 | Temp 99.3°F | Resp 17 | Ht 70.0 in | Wt >= 6400 oz

## 2015-05-18 DIAGNOSIS — G4733 Obstructive sleep apnea (adult) (pediatric): Secondary | ICD-10-CM

## 2015-05-18 DIAGNOSIS — E1165 Type 2 diabetes mellitus with hyperglycemia: Secondary | ICD-10-CM

## 2015-05-18 DIAGNOSIS — Z9989 Dependence on other enabling machines and devices: Secondary | ICD-10-CM

## 2015-05-18 LAB — CBC
HCT: 43.8 % (ref 39.0–52.0)
Hemoglobin: 15.4 g/dL (ref 13.0–17.0)
MCH: 31.2 pg (ref 26.0–34.0)
MCHC: 35.2 g/dL (ref 30.0–36.0)
MCV: 88.7 fL (ref 78.0–100.0)
MPV: 11.1 fL (ref 8.6–12.4)
Platelets: 255 10*3/uL (ref 150–400)
RBC: 4.94 MIL/uL (ref 4.22–5.81)
RDW: 14.5 % (ref 11.5–15.5)
WBC: 13.7 10*3/uL — AB (ref 4.0–10.5)

## 2015-05-18 LAB — COMPLETE METABOLIC PANEL WITH GFR
ALBUMIN: 4.2 g/dL (ref 3.6–5.1)
ALK PHOS: 56 U/L (ref 40–115)
ALT: 51 U/L — AB (ref 9–46)
AST: 33 U/L (ref 10–40)
BILIRUBIN TOTAL: 1 mg/dL (ref 0.2–1.2)
BUN: 19 mg/dL (ref 7–25)
CALCIUM: 9.6 mg/dL (ref 8.6–10.3)
CO2: 24 mmol/L (ref 20–31)
CREATININE: 0.72 mg/dL (ref 0.60–1.35)
Chloride: 103 mmol/L (ref 98–110)
GFR, Est Non African American: >89 mL/min (ref >=60)
Glucose, Bld: 145 mg/dL — ABNORMAL HIGH (ref 65–99)
Potassium: 4.4 mmol/L (ref 3.5–5.3)
Sodium: 139 mmol/L (ref 135–146)
TOTAL PROTEIN: 7.7 g/dL (ref 6.1–8.1)

## 2015-05-18 LAB — LIPID PANEL
Cholesterol: 145 mg/dL (ref 125–200)
HDL: 48 mg/dL (ref >=40)
LDL CALC: 66 mg/dL (ref <130)
Total CHOL/HDL Ratio: 3 Ratio (ref <=5.0)
Triglycerides: 156 mg/dL — ABNORMAL HIGH (ref <150)
VLDL: 31 mg/dL — ABNORMAL HIGH (ref <30)

## 2015-05-18 LAB — TSH: TSH: 2.61 mIU/L (ref 0.40–4.50)

## 2015-05-18 LAB — GLUCOSE, POCT (MANUAL RESULT ENTRY): POC Glucose: 155 mg/dl — AB (ref 70–99)

## 2015-05-18 LAB — POCT GLYCOSYLATED HEMOGLOBIN (HGB A1C): Hemoglobin A1C: 5.5

## 2015-05-18 MED ORDER — LISINOPRIL 10 MG PO TABS: 10.0000 mg | ORAL_TABLET | Freq: Every day | ORAL | Status: DC

## 2015-05-18 MED ORDER — ATORVASTATIN CALCIUM 20 MG PO TABS: ORAL_TABLET | ORAL | Status: DC

## 2015-05-18 MED ORDER — METFORMIN HCL 1000 MG PO TABS: ORAL_TABLET | ORAL | Status: DC

## 2015-05-18 NOTE — Progress Notes (Signed)
Subjective:    Patient ID: [name], male    DOB: 03/15/1980, 35 y.o.   MRN: [id number]  HPI  This is a pleasant 35 yo male who presents today for follow up of DM, obesity, sleep apnea. 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 He was last seen 03/17/14 following 03/10/14 diagnosis of DM. He was seen by nutrition. He lost his health insurance and has not had follow up care. He reports that he has been consistently taking his metformin, but not his other meds.  Checking blood sugars a few times a week, running 130s. Denies any lows or hypoglycemic episodes.  Had lost 20 pounds while watching carbs, soda intake. Has not been watching his diet as carefully, but continues to eat a lot of chicken and salads.   Sleeping ok, using CPAP.   [facility] and given diagnosis of bronchitis. He was given doxycycline, prednisone 15 mg po qd. Improvement of symptoms, no SOB, rare wheeze.     Past Medical History  Diagnosis Date  . Cellulitis   . Sleep apnea   . Diabetes mellitus without complication [facility](HCC)    Past Surgical History  Procedure Laterality Date  . None     Family History  Problem Relation Age of Onset  . Diabetes Mother   . Diabetes Sister    Social History  Substance Use Topics  . Smoking status: Never Smoker   . Smokeless tobacco: None  . Alcohol Use: No     Review of Systems No chest pain, no SOB, no wheeze, + LE edema L>R    Objective:   Physical Exam  Constitutional: He is oriented to person, place, and time. He appears well-developed and well-nourished. No distress.  Morbidly obese.   HENT:  Head: Normocephalic.  Cardiovascular: Normal rate, regular rhythm and normal heart sounds.   Pulmonary/Chest: Effort normal and breath sounds normal.  Neurological: He is alert and oriented to person, place, and time.  Skin: Skin is warm and dry. He is not diaphoretic.  Bilateral lower legs with +2-3 edema,  chronic appearing darkening.   Psychiatric: He has a normal mood and affect. His behavior is normal. Judgment and thought content normal.  Vitals reviewed.   BP 138/90 mmHg  Pulse 100  Temp(Src) 99.3 F (37.4 C) (Oral)  Resp 17  Ht 5\' 10"  (1.778 m)  Wt 410 lb (185.975 kg)  BMI 58.83 kg/m2  SpO2 100% Wt Readings from Last 3 Encounters:  05/18/15 410 lb (185.975 kg)  03/17/14 416 lb (188.696 kg)  03/17/14 418 lb 14.4 oz (190.012 kg)   Results for orders placed or performed in visit on 05/18/15  POCT glucose (manual entry)  Result Value Ref Range   POC Glucose 155 (A) 70 - 99 mg/dl  POCT glycosylated hemoglobin (Hb A1C)  Result Value Ref Range   Hemoglobin A1C 5.5        Assessment & Plan:  1. Uncontrolled type 2 diabetes mellitus with hyperglycemia, without long-term current use of insulin (HCC) - huge improvement of A1C, continue healthy food choices - POCT glucose (manual entry) - POCT glycosylated hemoglobin (Hb A1C) - CBC - Lipid panel - COMPLETE METABOLIC PANEL WITH GFR - lisinopril (PRINIVIL,ZESTRIL) 10 MG tablet; Take 1 tablet (10 mg total) by mouth daily.  Dispense: 90 tablet; Refill: 3 - metFORMIN (GLUCOPHAGE) 1000 MG tablet; TAKE ONE TABLET BY MOUTH TWICE DAILY  WITH A MEAL.  Dispense: 180 tablet; Refill: 3 - atorvastatin (LIPITOR) 20 MG tablet; TAKE ONE TABLET BY MOUTH ONCE DAILY.  Dispense: 90 tablet; Refill: 3  2. Morbid obesity due to excess calories (HCC) - provided written and verbal resources for weight loss, including bariatric surgery  - CBC - Lipid panel - COMPLETE METABOLIC PANEL WITH GFR - TSH  3. OSA on CPAP - encouraged him to continue to use consistently  4. Lower leg edema - encouraged compression socks, walking as much as possible, elevate when sitting as possible.   - follow up 3-6 months   Olean Ree, FNP-BC  [facility], [facility]  05/18/2015 10:17 AM

## 2015-05-18 NOTE — Patient Instructions (Addendum)
Please come in for a recheck in 4-6 months   IF you received an x-ray today, you will receive an invoice from [facility]. Please contact [facility] at [phone] with questions or concerns regarding your invoice.   IF you received labwork today, you will receive an invoice from [facility]. Please contact [facility] at [phone] with questions or concerns regarding your invoice.   Our billing staff will not be able to assist you with questions regarding bills from these companies.  You will be contacted with the lab results as soon as they are available. The fastest way to get your results is to activate your My Chart account. Instructions are located on the last page of this paperwork. If you have not heard from us regarding the results in 2 weeks, please contact this office.  Weight loss resources  [facility]- [phone] [facility] (weight loss surgery)- [phone] [facility]- [phone] Weight Watchers- online and in person meetings- www.weightwatchers.com [facility]- [phone] [facility]- [phone] [facility]- [phone]  Please get some compression socks, walk as much as possible, elevate legs when you can

## 2015-09-06 LAB — HM DIABETES EYE EXAM

## 2015-10-11 ENCOUNTER — Encounter: Payer: Self-pay | Admitting: Family Medicine

## 2016-05-01 DIAGNOSIS — R05 Cough: Secondary | ICD-10-CM | POA: Diagnosis not present

## 2016-05-01 DIAGNOSIS — R509 Fever, unspecified: Secondary | ICD-10-CM | POA: Diagnosis not present

## 2016-05-01 DIAGNOSIS — R6889 Other general symptoms and signs: Secondary | ICD-10-CM | POA: Diagnosis not present

## 2016-05-01 DIAGNOSIS — R17 Unspecified jaundice: Secondary | ICD-10-CM | POA: Diagnosis not present

## 2016-05-01 DIAGNOSIS — N390 Urinary tract infection, site not specified: Secondary | ICD-10-CM | POA: Diagnosis not present

## 2016-05-03 ENCOUNTER — Inpatient Hospital Stay (HOSPITAL_BASED_OUTPATIENT_CLINIC_OR_DEPARTMENT_OTHER)
Admission: EM | Admit: 2016-05-03 | Discharge: 2016-05-06 | DRG: 603 | Disposition: A | Discharge status: Home / Self Care | Payer: BLUE CROSS/BLUE SHIELD | Attending: Internal Medicine | Admitting: Internal Medicine

## 2016-05-03 ENCOUNTER — Encounter (HOSPITAL_BASED_OUTPATIENT_CLINIC_OR_DEPARTMENT_OTHER): Payer: Self-pay | Admitting: Emergency Medicine

## 2016-05-03 ENCOUNTER — Emergency Department (HOSPITAL_BASED_OUTPATIENT_CLINIC_OR_DEPARTMENT_OTHER): Payer: BLUE CROSS/BLUE SHIELD

## 2016-05-03 DIAGNOSIS — Z6841 Body Mass Index (BMI) 40.0 and over, adult: Secondary | ICD-10-CM | POA: Diagnosis not present

## 2016-05-03 DIAGNOSIS — E1165 Type 2 diabetes mellitus with hyperglycemia: Secondary | ICD-10-CM | POA: Diagnosis present

## 2016-05-03 DIAGNOSIS — L03115 Cellulitis of right lower limb: Principal | ICD-10-CM | POA: Diagnosis present

## 2016-05-03 DIAGNOSIS — Z9989 Dependence on other enabling machines and devices: Secondary | ICD-10-CM

## 2016-05-03 DIAGNOSIS — Z7984 Long term (current) use of oral hypoglycemic drugs: Secondary | ICD-10-CM

## 2016-05-03 DIAGNOSIS — M7989 Other specified soft tissue disorders: Secondary | ICD-10-CM

## 2016-05-03 DIAGNOSIS — I872 Venous insufficiency (chronic) (peripheral): Secondary | ICD-10-CM | POA: Diagnosis present

## 2016-05-03 DIAGNOSIS — Z880 Allergy status to penicillin: Secondary | ICD-10-CM | POA: Diagnosis not present

## 2016-05-03 DIAGNOSIS — R600 Localized edema: Secondary | ICD-10-CM | POA: Diagnosis not present

## 2016-05-03 DIAGNOSIS — Z833 Family history of diabetes mellitus: Secondary | ICD-10-CM

## 2016-05-03 DIAGNOSIS — M79662 Pain in left lower leg: Secondary | ICD-10-CM

## 2016-05-03 DIAGNOSIS — E7800 Pure hypercholesterolemia, unspecified: Secondary | ICD-10-CM | POA: Diagnosis not present

## 2016-05-03 DIAGNOSIS — L03119 Cellulitis of unspecified part of limb: Secondary | ICD-10-CM | POA: Diagnosis present

## 2016-05-03 DIAGNOSIS — I10 Essential (primary) hypertension: Secondary | ICD-10-CM | POA: Diagnosis present

## 2016-05-03 DIAGNOSIS — G4733 Obstructive sleep apnea (adult) (pediatric): Secondary | ICD-10-CM | POA: Diagnosis present

## 2016-05-03 DIAGNOSIS — M79661 Pain in right lower leg: Secondary | ICD-10-CM | POA: Diagnosis not present

## 2016-05-03 DIAGNOSIS — R079 Chest pain, unspecified: Secondary | ICD-10-CM | POA: Diagnosis not present

## 2016-05-03 HISTORY — DX: Essential (primary) hypertension: I10

## 2016-05-03 HISTORY — DX: Pure hypercholesterolemia, unspecified: E78.00

## 2016-05-03 LAB — CBC WITH DIFFERENTIAL/PLATELET
Basophils Absolute: 0 10*3/uL (ref 0.0–0.1)
Basophils Relative: 0 %
EOS ABS: 0.1 10*3/uL (ref 0.0–0.7)
Eosinophils Relative: 1 %
HEMATOCRIT: 38 % — AB (ref 39.0–52.0)
HEMOGLOBIN: 13.1 g/dL (ref 13.0–17.0)
LYMPHS ABS: 2.1 10*3/uL (ref 0.7–4.0)
LYMPHS PCT: 21 %
MCH: 30.5 pg (ref 26.0–34.0)
MCHC: 34.5 g/dL (ref 30.0–36.0)
MCV: 88.6 fL (ref 78.0–100.0)
Monocytes Absolute: 0.9 10*3/uL (ref 0.1–1.0)
Monocytes Relative: 10 %
NEUTROS ABS: 6.5 10*3/uL (ref 1.7–7.7)
NEUTROS PCT: 68 %
Platelets: 197 10*3/uL (ref 150–400)
RBC: 4.29 MIL/uL (ref 4.22–5.81)
RDW: 13.6 % (ref 11.5–15.5)
WBC: 9.6 10*3/uL (ref 4.0–10.5)

## 2016-05-03 LAB — URINALYSIS, MICROSCOPIC (REFLEX)

## 2016-05-03 LAB — COMPREHENSIVE METABOLIC PANEL
ALT: 43 U/L (ref 17–63)
ANION GAP: 8 (ref 5–15)
AST: 27 U/L (ref 15–41)
Albumin: 3.6 g/dL (ref 3.5–5.0)
Alkaline Phosphatase: 64 U/L (ref 38–126)
BUN: 14 mg/dL (ref 6–20)
CHLORIDE: 100 mmol/L — AB (ref 101–111)
CO2: 27 mmol/L (ref 22–32)
CREATININE: 0.64 mg/dL (ref 0.61–1.24)
Calcium: 9.3 mg/dL (ref 8.9–10.3)
Glucose, Bld: 314 mg/dL — ABNORMAL HIGH (ref 65–99)
POTASSIUM: 4.1 mmol/L (ref 3.5–5.1)
SODIUM: 135 mmol/L (ref 135–145)
Total Bilirubin: 2.1 mg/dL — ABNORMAL HIGH (ref 0.3–1.2)
Total Protein: 7.6 g/dL (ref 6.5–8.1)

## 2016-05-03 LAB — URINALYSIS, ROUTINE W REFLEX MICROSCOPIC
Bilirubin Urine: NEGATIVE
Glucose, UA: >=500 mg/dL — AB
HGB URINE DIPSTICK: NEGATIVE
Ketones, ur: NEGATIVE mg/dL
Leukocytes, UA: NEGATIVE
Nitrite: NEGATIVE
PH: 6 (ref 5.0–8.0)
Protein, ur: 30 mg/dL — AB
SPECIFIC GRAVITY, URINE: 1.024 (ref 1.005–1.030)

## 2016-05-03 LAB — I-STAT CG4 LACTIC ACID, ED
LACTIC ACID, VENOUS: 1.07 mmol/L (ref 0.5–1.9)
Lactic Acid, Venous: 0.89 mmol/L (ref 0.5–1.9)

## 2016-05-03 LAB — BRAIN NATRIURETIC PEPTIDE: B NATRIURETIC PEPTIDE 5: 43.6 pg/mL (ref 0.0–100.0)

## 2016-05-03 LAB — GLUCOSE, CAPILLARY: Glucose-Capillary: 393 mg/dL — ABNORMAL HIGH (ref 65–99)

## 2016-05-03 MED ORDER — SODIUM CHLORIDE 0.9 % IV BOLUS (SEPSIS)
1000.0000 mL | Freq: Once | INTRAVENOUS | Status: AC
  Administered 2016-05-03: 1000 mL via INTRAVENOUS

## 2016-05-03 MED ORDER — VANCOMYCIN HCL IN DEXTROSE 1-5 GM/200ML-% IV SOLN
1000.0000 mg | Freq: Once | INTRAVENOUS | Status: AC
  Administered 2016-05-03: 1000 mg via INTRAVENOUS
  Filled 2016-05-03: qty 200

## 2016-05-03 MED ORDER — SODIUM CHLORIDE 0.9 % IV SOLN
INTRAVENOUS | Status: DC
  Administered 2016-05-04: 01:00:00 via INTRAVENOUS

## 2016-05-03 MED ORDER — ACETAMINOPHEN 325 MG PO TABS
650.0000 mg | ORAL_TABLET | Freq: Once | ORAL | Status: AC
  Administered 2016-05-03: 650 mg via ORAL
  Filled 2016-05-03: qty 2

## 2016-05-03 NOTE — ED Triage Notes (Signed)
Pain and redness to R lower leg since yesterday. Hx of cellulitis.

## 2016-05-03 NOTE — ED Notes (Signed)
Pt in US at this time 

## 2016-05-03 NOTE — ED Provider Notes (Signed)
[facility] DEPT [facility] Provider Note   CSN: [id number] Arrival date & time: 05/03/16  1420     History   Chief Complaint Chief Complaint  Patient presents with  . Recurrent Skin Infections    HPI [name] is a 36 y.o. male.  HPI [name] is a 36 y.o. male with history of diabetes, hypertension, cellulitis presents to emergency department complaining of fever, chills, right lower leg pain. Patient states his fever and chills started 4 days ago. He went to primary care doctor and was diagnosed with UTI. He states yesterday he developed increased swelling and pain to the right lower leg. History of cellulitis in the same area in the past, reports feels the same. Fever up to 1023 days ago. He has been taking ibuprofen. He is on Levaquin for his UTI. He denies any upper respiratory symptoms. No shortness of breath or cough. No abdominal pain. No chest pain.  Past Medical History:  Diagnosis Date  . Cellulitis   . Diabetes mellitus without complication ([place])   . High cholesterol   . Hypertension   . Sleep apnea     Patient Active Problem List   Diagnosis Date Noted  . OSA on CPAP 03/10/2014  . Diabetes mellitus type 2, uncontrolled ([place]) 03/10/2014  . Morbid obesity ([place]) 03/01/2014    Past Surgical History:  Procedure Laterality Date  . none         Home Medications    Prior to Admission medications   Medication Sig Start Date End Date Taking? Authorizing Provider  levofloxacin (LEVAQUIN) 250 MG tablet Take 250 mg by mouth daily.   Yes Historical Provider, MD  lisinopril (PRINIVIL,ZESTRIL) 10 MG tablet Take 1 tablet (10 mg total) by mouth daily. 05/18/15  Yes Elby Beck, FNP  metFORMIN (GLUCOPHAGE) 1000 MG tablet TAKE ONE TABLET BY MOUTH TWICE DAILY WITH A MEAL. 05/18/15  Yes Elby Beck, FNP  aspirin 81 MG tablet Take 81 mg by mouth daily. Reported on 05/18/2015    Historical Provider, MD  atorvastatin (LIPITOR) 20 MG tablet TAKE ONE TABLET BY  MOUTH ONCE DAILY. 05/18/15   Elby Beck, FNP  Blood Glucose Monitoring Suppl KIT Test blood sugar as directed Patient not taking: Reported on 05/18/2015 03/18/14   Elby Beck, FNP  doxycycline (DORYX) 100 MG EC tablet Take 100 mg by mouth 2 (two) times daily.    Historical Provider, MD  glucose blood test strip Use as instructed Patient not taking: Reported on 05/18/2015 03/18/14   Elby Beck, FNP  HYDROcodone-homatropine [facility]) 5-1.5 MG/5ML syrup Take 5 mLs by mouth every 6 (six) hours as needed for cough.    Historical Provider, MD  Lancets MISC Test blood sugar as directed. Patient not taking: Reported on 05/18/2015 03/18/14   Elby Beck, FNP  predniSONE (DELTASONE) 5 MG tablet Take 5 mg by mouth daily with breakfast.    Historical Provider, MD    Family History Family History  Problem Relation Age of Onset  . Diabetes Mother   . Diabetes Sister     Social History Social History  Substance Use Topics  . Smoking status: Never Smoker  . Smokeless tobacco: Never Used  . Alcohol use No     Allergies   Penicillins   Review of Systems Review of Systems  Constitutional: Positive for chills and fever.  HENT: Negative.   Respiratory: Negative for cough, chest tightness and shortness of breath.   Cardiovascular: Positive for leg  swelling. Negative for chest pain and palpitations.  Gastrointestinal: Negative for abdominal distention, abdominal pain, diarrhea, nausea and vomiting.  Genitourinary: Negative for dysuria, frequency, hematuria and urgency.  Musculoskeletal: Positive for arthralgias and myalgias. Negative for neck pain and neck stiffness.  Skin: Negative for rash.  Allergic/Immunologic: Negative for immunocompromised state.  Neurological: Negative for dizziness, weakness, light-headedness, numbness and headaches.  All other systems reviewed and are negative.    Physical Exam Updated Vital Signs BP (!) 168/101 (BP Location: Left Arm)   Pulse  106   Temp 99.9 F (37.7 C) (Oral)   Resp (!) 32   Ht _0  (1.753 m)   Wt (!) 194.1 kg   SpO2 96%   BMI 63.20 kg/m   Physical Exam  Constitutional: He is oriented to person, place, and time. He appears well-developed and well-nourished. No distress.  HENT:  Head: Normocephalic and atraumatic.  Eyes: Conjunctivae are normal.  Neck: Neck supple.  Cardiovascular: Normal rate, regular rhythm and normal heart sounds.   Pulmonary/Chest: Effort normal. No respiratory distress. He has no wheezes. He has no rales.  Abdominal: Soft. Bowel sounds are normal. He exhibits no distension. There is no tenderness. There is no rebound.  Musculoskeletal: He exhibits no edema.  Edema to bilateral lower extremities. There is erythema to bilateral lower extremities. Right anterior lower extremity does feel warm to the touch and is tender. No warmth or tenderness to the left lower extremity.  Neurological: He is alert and oriented to person, place, and time.  Skin: Skin is warm and dry.  Nursing note and vitals reviewed.    ED Treatments / Results  Labs (all labs ordered are listed, but only abnormal results are displayed) Labs Reviewed  CULTURE, BLOOD (ROUTINE X 2)  CULTURE, BLOOD (ROUTINE X 2)  COMPREHENSIVE METABOLIC PANEL  CBC WITH DIFFERENTIAL/PLATELET  URINALYSIS, ROUTINE W REFLEX MICROSCOPIC  BRAIN NATRIURETIC PEPTIDE  I-STAT CG4 LACTIC ACID, ED    EKG  EKG Interpretation None       Radiology Dg Chest 2 View  Result Date: 05/03/2016 CLINICAL DATA:  Pain and redness to RIGHT lower leg since yesterday, history cellulitis, diabetes mellitus, hypertension EXAM: CHEST  2 VIEW COMPARISON:  None FINDINGS: Normal heart size, mediastinal contours, and pulmonary vascularity. Lungs grossly clear. No definite infiltrate, pleural effusion or pneumothorax. Bones unremarkable. IMPRESSION: No acute abnormalities. Electronically Signed   By: Lavonia Dana M.D.   On: 05/03/2016 14:54     Procedures Procedures (including critical care time)  Medications Ordered in ED Medications  acetaminophen (TYLENOL) tablet 650 mg (650 mg Oral Given 05/03/16 1431)     Initial Impression / Assessment and Plan / ED Course  I have reviewed the triage vital signs and the nursing notes.  Pertinent labs & imaging results that were available during my care of the patient were reviewed by me and considered in my medical decision making (see chart for details).   patient with recurrent right lower extremity cellulitis. Patient is febrile, tachypnea, but denies any shortness of breath. He is tachycardic. Will check labs, blood cultures, vancomycin ordered for antibiotics. I will hold off on ordering IV fluids until get chest x-ray and BNP back. Patient with significant lower extremity swelling bilaterally.   Chest x-ray is negative. BNP is negative. We will start IV fluids for possible sepsis, however highly unlikely given normal WBC, normal lactic acid, and improved final signs. Pending venous Doppler of the right leg and UA.   4:31 PM Pt signed  out to Dr. Gustavus Messing at shift change pending [place] and UA and VS reassessment after fluids.    Final Clinical Impressions(s) / ED Diagnoses   Final diagnoses:  Pain and swelling of left lower leg  Cellulitis of right lower leg    New Prescriptions New Prescriptions   No medications on file     Jeannett Senior, PA-C 05/03/16 [place], MD 05/04/16 1459

## 2016-05-03 NOTE — ED Notes (Signed)
Pt was seen at [facility] 2 days ago and started on levaquin for UTI. Pt had ibuprofen at 7 this morning.

## 2016-05-04 ENCOUNTER — Encounter (HOSPITAL_COMMUNITY): Payer: Self-pay | Admitting: *Deleted

## 2016-05-04 DIAGNOSIS — I10 Essential (primary) hypertension: Secondary | ICD-10-CM | POA: Diagnosis not present

## 2016-05-04 DIAGNOSIS — G4733 Obstructive sleep apnea (adult) (pediatric): Secondary | ICD-10-CM | POA: Diagnosis not present

## 2016-05-04 DIAGNOSIS — Z7984 Long term (current) use of oral hypoglycemic drugs: Secondary | ICD-10-CM | POA: Diagnosis not present

## 2016-05-04 DIAGNOSIS — Z833 Family history of diabetes mellitus: Secondary | ICD-10-CM | POA: Diagnosis not present

## 2016-05-04 DIAGNOSIS — L03115 Cellulitis of right lower limb: Secondary | ICD-10-CM | POA: Diagnosis not present

## 2016-05-04 DIAGNOSIS — E1165 Type 2 diabetes mellitus with hyperglycemia: Secondary | ICD-10-CM | POA: Diagnosis not present

## 2016-05-04 DIAGNOSIS — Z6841 Body Mass Index (BMI) 40.0 and over, adult: Secondary | ICD-10-CM | POA: Diagnosis not present

## 2016-05-04 DIAGNOSIS — M79661 Pain in right lower leg: Secondary | ICD-10-CM | POA: Diagnosis present

## 2016-05-04 DIAGNOSIS — I872 Venous insufficiency (chronic) (peripheral): Secondary | ICD-10-CM | POA: Diagnosis present

## 2016-05-04 DIAGNOSIS — L03119 Cellulitis of unspecified part of limb: Secondary | ICD-10-CM | POA: Diagnosis present

## 2016-05-04 DIAGNOSIS — E7800 Pure hypercholesterolemia, unspecified: Secondary | ICD-10-CM | POA: Diagnosis present

## 2016-05-04 DIAGNOSIS — Z880 Allergy status to penicillin: Secondary | ICD-10-CM | POA: Diagnosis not present

## 2016-05-04 LAB — BASIC METABOLIC PANEL
ANION GAP: 10 (ref 5–15)
BUN: 10 mg/dL (ref 6–20)
CALCIUM: 8.8 mg/dL — AB (ref 8.9–10.3)
CO2: 24 mmol/L (ref 22–32)
Chloride: 101 mmol/L (ref 101–111)
Creatinine, Ser: 0.62 mg/dL (ref 0.61–1.24)
GFR calc Af Amer: >60 mL/min (ref >60)
GFR calc non Af Amer: >60 mL/min (ref >60)
GLUCOSE: 344 mg/dL — AB (ref 65–99)
POTASSIUM: 4.1 mmol/L (ref 3.5–5.1)
Sodium: 135 mmol/L (ref 135–145)

## 2016-05-04 LAB — GLUCOSE, CAPILLARY
Glucose-Capillary: 307 mg/dL — ABNORMAL HIGH (ref 65–99)
Glucose-Capillary: 330 mg/dL — ABNORMAL HIGH (ref 65–99)
Glucose-Capillary: 212 mg/dL — ABNORMAL HIGH (ref 65–99)
Glucose-Capillary: 204 mg/dL — ABNORMAL HIGH (ref 65–99)

## 2016-05-04 LAB — CBC
HEMATOCRIT: 36.6 % — AB (ref 39.0–52.0)
HEMOGLOBIN: 12.2 g/dL — AB (ref 13.0–17.0)
MCH: 29.5 pg (ref 26.0–34.0)
MCHC: 33.3 g/dL (ref 30.0–36.0)
MCV: 88.6 fL (ref 78.0–100.0)
Platelets: 177 10*3/uL (ref 150–400)
RBC: 4.13 MIL/uL — ABNORMAL LOW (ref 4.22–5.81)
RDW: 13.7 % (ref 11.5–15.5)
WBC: 8.3 10*3/uL (ref 4.0–10.5)

## 2016-05-04 LAB — HIV ANTIBODY (ROUTINE TESTING W REFLEX): HIV Screen 4th Generation wRfx: NONREACTIVE

## 2016-05-04 MED ORDER — INSULIN GLARGINE 100 UNIT/ML ~~LOC~~ SOLN
15.0000 [IU] | Freq: Every day | SUBCUTANEOUS | Status: DC
  Administered 2016-05-04 (×2): 15 [IU] via SUBCUTANEOUS
  Filled 2016-05-04 (×3): qty 0.15

## 2016-05-04 MED ORDER — ONDANSETRON HCL 4 MG/2ML IJ SOLN: 4.0000 mg | Freq: Four times a day (QID) | INTRAMUSCULAR | Status: DC | PRN

## 2016-05-04 MED ORDER — ONDANSETRON HCL 4 MG PO TABS: 4.0000 mg | ORAL_TABLET | Freq: Four times a day (QID) | ORAL | Status: DC | PRN

## 2016-05-04 MED ORDER — ATORVASTATIN CALCIUM 20 MG PO TABS
20.0000 mg | ORAL_TABLET | Freq: Every day | ORAL | Status: DC
  Administered 2016-05-04 – 2016-05-05 (×2): 20 mg via ORAL
  Filled 2016-05-04 (×2): qty 1

## 2016-05-04 MED ORDER — ACETAMINOPHEN 650 MG RE SUPP: 650.0000 mg | Freq: Four times a day (QID) | RECTAL | Status: DC | PRN

## 2016-05-04 MED ORDER — INSULIN ASPART 100 UNIT/ML ~~LOC~~ SOLN
0.0000 [IU] | Freq: Every day | SUBCUTANEOUS | Status: DC
  Administered 2016-05-05: 2 [IU] via SUBCUTANEOUS

## 2016-05-04 MED ORDER — ACETAMINOPHEN 325 MG PO TABS
650.0000 mg | ORAL_TABLET | Freq: Four times a day (QID) | ORAL | Status: DC | PRN
  Administered 2016-05-05 – 2016-05-06 (×2): 650 mg via ORAL
  Filled 2016-05-04 (×2): qty 2

## 2016-05-04 MED ORDER — ENOXAPARIN SODIUM 100 MG/ML ~~LOC~~ SOLN
90.0000 mg | Freq: Every day | SUBCUTANEOUS | Status: DC
  Administered 2016-05-04 – 2016-05-06 (×3): 90 mg via SUBCUTANEOUS
  Filled 2016-05-04 (×3): qty 1

## 2016-05-04 MED ORDER — CEFAZOLIN SODIUM-DEXTROSE 2-4 GM/100ML-% IV SOLN
2.0000 g | Freq: Three times a day (TID) | INTRAVENOUS | Status: DC
  Administered 2016-05-04 – 2016-05-06 (×8): 2 g via INTRAVENOUS
  Filled 2016-05-04 (×9): qty 100

## 2016-05-04 MED ORDER — SODIUM CHLORIDE 0.9 % IV SOLN
INTRAVENOUS | Status: AC
  Administered 2016-05-04: 02:00:00 via INTRAVENOUS

## 2016-05-04 MED ORDER — OXYCODONE HCL 5 MG PO TABS
5.0000 mg | ORAL_TABLET | ORAL | Status: DC | PRN
  Administered 2016-05-04 – 2016-05-05 (×6): 5 mg via ORAL
  Filled 2016-05-04 (×6): qty 1

## 2016-05-04 MED ORDER — INSULIN ASPART 100 UNIT/ML ~~LOC~~ SOLN
0.0000 [IU] | Freq: Three times a day (TID) | SUBCUTANEOUS | Status: DC
  Administered 2016-05-04: 7 [IU] via SUBCUTANEOUS
  Administered 2016-05-04 (×2): 15 [IU] via SUBCUTANEOUS
  Administered 2016-05-05 (×3): 7 [IU] via SUBCUTANEOUS
  Administered 2016-05-06: 4 [IU] via SUBCUTANEOUS
  Administered 2016-05-06: 11 [IU] via SUBCUTANEOUS

## 2016-05-04 NOTE — Progress Notes (Signed)
[name] is a 36 y.o. male with a past medical history significant for MO, OSA on CPAP, venous insufficiency and recurrent cellulitis, NIDDM and HTN who presents with cellulitis. He was started on IV antibiotics .  MONITOR. ELEVATE the lower extremities and monitor blood cultures.   [name], MD [phone]

## 2016-05-04 NOTE — Progress Notes (Signed)
Inpatient Diabetes Program Recommendations  AACE/ADA: New Consensus Statement on Inpatient Glycemic Control (2015)  Target Ranges:  Prepandial:   less than 140 mg/dL      Peak postprandial:   less than 180 mg/dL (1-2 hours)      Critically ill patients:  140 - 180 mg/dL   Lab Results  Component Value Date   GLUCAP 307 (H) 05/04/2016   HGBA1C 5.5 05/18/2015    Review of Glycemic Control  Diabetes history: dm2, obesity (morbid obesity that has not improved)  Inpatient Diabetes Program Recommendations:   Insulin - Basal: Please consider increasing Lantus 22 units daily (Current = 15 units daily)  Correction (SSI): Agree with current resistant scale Novolog 0-20 units tidac and 0-5 units qhs  Oral Agents: Agree with holding Metformin 1000 mg bid while in hospital (was on this same regimen 1 year ago)  Will follow A1C results.  Thank you,  Kristine LineaKaren Vermelle Cammarata, RN, MSN Diabetes Coordinator Inpatient Diabetes Program [phone] (Team Pager)

## 2016-05-04 NOTE — H&P (Signed)
History and Physical  Patient Name: [name]     TMH:[id number]    DOB: 03-05-1981    DOA: 05/03/2016 PCP: Leandrew Koyanagi, MD (Inactive)   Patient coming from: Home --> [facility]  Chief Complaint: Flu-like illness, leg pain  HPI: [name] is a 36 y.o. male with a past medical history significant for MO, OSA on CPAP, venous insufficiency and recurrent cellulitis, NIDDM and HTN who presents with cellulitis.  Patient was in his usual state of health until a few days ago, he woke up with sweats, chills, and fever. The next day he went to see urgent care for influenza PCR is negative, he had 6-15 white cells in his urine, was diagnosed with UTI, and started on Levaquin for UTI and empiric Tamiflu. Culture was not obtained, but I can see and caregiver.  Around that same time, the patient noticed that his right leg at the ankle was done to get red, swollen, and painful, similar to previous episodes of cellulitis that he has had. Then over the next 2 days, he took the Levaquin as prescribed, but developed worsening leg pain, persistent fever, malaise, and chills, so today he came to the emergency room. Of note the patient has chronic venous insufficiency, with bad swelling of both legs usually worse in the afternoon, better in the morning when he first wakes up. He has had recurrent cellulitis.  He has had no respiratory symptoms at all, he has had no urinary symptoms at all.  ED course: -Temp 101F, heart rate 111, respirations 44, blood pressure 168/101, pulse oximetry normal -Na 135, K 4.1, Cr 0.64, WBC 9.6K, Hgb 13.1, glucose >300 (his normal sugars are >200, not been adherent to metformin or diet recently) -TBili 2.1, indirect bili this [facility] was elevated, normal direct, no abdominal pain -Lactic acid 1.07 -BNP normal -Urinalysis with no leukocytes or red blood cells -Chest x-ray without focal opacity -Bilateral lower leg Dopplers negative for DVT -Blood cultures were obtained,  vancomycin was administered, and [facility] was asked to accept the patient in transfer for cellulitis.     ROS: Review of Systems  Constitutional: Positive for chills, diaphoresis, fever and malaise/fatigue.  Respiratory: Negative for cough, sputum production and shortness of breath.   Cardiovascular: Positive for leg swelling.  Gastrointestinal: Negative for abdominal pain, blood in stool, constipation, diarrhea and melena.  Genitourinary: Negative for dysuria, flank pain, frequency, hematuria and urgency.  Musculoskeletal: Negative for myalgias.  All other systems reviewed and are negative.         Past Medical History:  Diagnosis Date  . Cellulitis   . Diabetes mellitus without complication ([place])   . High cholesterol   . Hypertension   . Sleep apnea     Past Surgical History:  Procedure Laterality Date  . none      Social History: Patient lives with his sister when he is in town.  The patient walks unassisted.  He drives a truck.  He is not a smoker.  He rarely uses alcohol.    Allergies  Allergen Reactions  . Penicillins Rash    Tolerates cephalosporins    Family history: family history includes Diabetes in his mother and sister.  Prior to Admission medications   Medication Sig Start Date End Date Taking? Authorizing Provider  lisinopril (PRINIVIL,ZESTRIL) 10 MG tablet Take 1 tablet (10 mg total) by mouth daily. 05/18/15  Yes Elby Beck, FNP  metFORMIN (GLUCOPHAGE) 1000 MG tablet TAKE ONE TABLET BY  MOUTH TWICE DAILY WITH A MEAL. 05/18/15  Yes Elby Beck, FNP  atorvastatin (LIPITOR) 20 MG tablet TAKE ONE TABLET BY MOUTH ONCE DAILY. 05/18/15   Elby Beck, FNP  Blood Glucose Monitoring Suppl KIT Test blood sugar as directed Patient not taking: Reported on 05/18/2015 03/18/14   Elby Beck, FNP  glucose blood test strip Use as instructed Patient not taking: Reported on 05/18/2015 03/18/14   Elby Beck, FNP  Lancets MISC Test blood sugar as  directed. Patient not taking: Reported on 05/18/2015 03/18/14   Elby Beck, FNP       Physical Exam: BP (!) 149/83 (BP Location: Right Arm)   Pulse (!) 101   Temp 99.7 F (37.6 C) (Oral)   Resp (!) 21   Ht '5\' 9"'  (1.753 m)   Wt (!) 186.1 kg (410 lb 4.4 oz)   SpO2 94%   BMI 60.59 kg/m  General appearance: Well-developed, obese adult male, alert and in no acute distress, sitting on side of bed.   Eyes: Anicteric, conjunctiva pink/inflamed, lids and lashes normal. PERRL.    ENT: No nasal deformity, discharge, epistaxis.  Hearing normal. OP moist without lesions.   Neck: No neck masses.  Trachea midline.  No thyromegaly/tenderness. Lymph: No cervical or supraclavicular lymphadenopathy. Skin: Warm and dry.  There is chronic brawny change of both lower legs.  There is some indentation on the left shin from an old injury with a tiller.  On the right lower leg, anterior ankle/shin, there is a red swelling that is tender to touch.  No ulcers, drainage. Cardiac: heart sounds distant.  Tachycardic, regular, nl S1-S2, no murmurs appreciated.  Capillary refill is brisk.  JVP not visible.   Radial pulses 2+ and symmetric. Respiratory: Normal respiratory rate and rhythm.  CTAB without rales or wheezes.  Lung sounds distant. Abdomen: Abdomen soft.  No TTP. No ascites, distension, hepatosplenomegaly.   MSK: No deformities or effusions.  No cyanosis or clubbing. Neuro: Cranial nerves normal.  Sensation intact to light touch. Speech is fluent.  Muscle strength normal.    Psych: Sensorium intact and responding to questions, attention normal.  Behavior appropriate.  Affect blunted.  Judgment and insight appear normal.     Labs on Admission:  I have personally reviewed following labs and imaging studies: CBC:  Recent Labs Lab 05/03/16 1505  WBC 9.6  NEUTROABS 6.5  HGB 13.1  HCT 38.0*  MCV 88.6  PLT 945   Basic Metabolic Panel:  Recent Labs Lab 05/03/16 1505  NA 135  K 4.1  CL 100*   CO2 27  GLUCOSE 314*  BUN 14  CREATININE 0.64  CALCIUM 9.3   GFR: Estimated Creatinine Clearance: 213.1 mL/min (by C-G formula based on SCr of 0.64 mg/dL).  Liver Function Tests:  Recent Labs Lab 05/03/16 1505  AST 27  ALT 43  ALKPHOS 64  BILITOT 2.1*  PROT 7.6  ALBUMIN 3.6   No results for input(s): LIPASE, AMYLASE in the last 168 hours. No results for input(s): AMMONIA in the last 168 hours. Coagulation Profile: No results for input(s): INR, PROTIME in the last 168 hours. Cardiac Enzymes: No results for input(s): CKTOTAL, CKMB, CKMBINDEX, TROPONINI in the last 168 hours. BNP (last 3 results) No results for input(s): PROBNP in the last 8760 hours. HbA1C: No results for input(s): HGBA1C in the last 72 hours. CBG:  Recent Labs Lab 05/03/16 2309  GLUCAP 393*   Lipid Profile: No results for input(s): CHOL, HDL,  LDLCALC, TRIG, CHOLHDL, LDLDIRECT in the last 72 hours. Thyroid Function Tests: No results for input(s): TSH, T4TOTAL, FREET4, T3FREE, THYROIDAB in the last 72 hours. Anemia Panel: No results for input(s): VITAMINB12, FOLATE, FERRITIN, TIBC, IRON, RETICCTPCT in the last 72 hours. Sepsis Labs: Lactic acid normal Invalid input(s): PROCALCITONIN, LACTICIDVEN Recent Results (from the past 240 hour(s))  Blood culture (routine x 2)     Status: None (Preliminary result)   Collection Time: 05/03/16  3:25 PM  Result Value Ref Range Status   Specimen Description   Final    BLOOD LEFT AC Performed at [facility], [address] [place], [place] [address]    Special Requests BOTTLES DRAWN AEROBIC AND ANAEROBIC 5ML EACH  Final   Culture PENDING  Incomplete   Report Status PENDING  Incomplete         Radiological Exams on Admission: Personally reviewed CXR shows no pneumonia, [place] reports reviewed: Dg Chest 2 View  Result Date: 05/03/2016 CLINICAL DATA:  Pain and redness to RIGHT lower leg since yesterday, history cellulitis, diabetes mellitus,  hypertension EXAM: CHEST  2 VIEW COMPARISON:  None FINDINGS: Normal heart size, mediastinal contours, and pulmonary vascularity. Lungs grossly clear. No definite infiltrate, pleural effusion or pneumothorax. Bones unremarkable. IMPRESSION: No acute abnormalities. Electronically Signed   By: Lavonia Dana M.D.   On: 05/03/2016 14:54   US Venous Img Lower Unilateral Right  Result Date: 05/03/2016 CLINICAL DATA:  Lower extremity pain and edema EXAM: RIGHT LOWER EXTREMITY VENOUS DUPLEX ULTRASOUND TECHNIQUE: Gray-scale sonography with graded compression, as well as color Doppler and duplex ultrasound were performed to evaluate the right lower extremity deep venous system from the level of the common femoral vein and including the common femoral, femoral, profunda femoral, popliteal and calf veins including the posterior tibial, peroneal and gastrocnemius veins when visible. The superficial great saphenous vein was also interrogated. Spectral Doppler was utilized to evaluate flow at rest and with distal augmentation maneuvers in the common femoral, femoral and popliteal veins. COMPARISON:  None. FINDINGS: Contralateral Common Femoral Vein: Respiratory phasicity is normal and symmetric with the symptomatic side. No evidence of thrombus. Normal compressibility. Common Femoral Vein: No evidence of thrombus. Normal compressibility, respiratory phasicity and response to augmentation. Saphenofemoral Junction: No evidence of thrombus. Normal compressibility and flow on color Doppler imaging. Profunda Femoral Vein: No evidence of thrombus. Normal compressibility and flow on color Doppler imaging. Femoral Vein: No evidence of thrombus. Normal compressibility, respiratory phasicity and response to augmentation. Popliteal Vein: No evidence of thrombus. Normal compressibility, respiratory phasicity and response to augmentation. Calf Veins: No evidence of thrombus. Normal compressibility and flow on color Doppler imaging. Superficial  Great Saphenous Vein: No evidence of thrombus. Normal compressibility and flow on color Doppler imaging. Venous Reflux:  None. Other Findings:  None. IMPRESSION: No evidence of right lower extremity deep venous thrombosis. Left common femoral vein also patent. Electronically Signed   By: Lowella Grip III M.D.   On: 05/03/2016 16:59        Assessment/Plan  1. Leg Cellulitis, SIRS:  Meets SIRS criteria, no evidence of organ damage.  Not an ulcer related infection. -Cefazolin IV -Elevate extremity -Follow cultures   2. Morbid obesity:   3. OSA:  -Continue CPAP at night  4. Non-insulin-dependent diabetes:  Poorly controlled. 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 -Hold metformin -Glargine 10 units daily -SSI with meals while in the hospital  5. Hypertension:  Poorly controlled. -  Continue atorvastatin -Hold BP meds until hemodynamics are clearer         DVT prophylaxis: Lovenox  Code Status: FULL  Family Communication: None present  Disposition Plan: Anticipate IV antibiotics for 1-2 days, then transition to orals when improving and discharge home. Consults called: None Admission status: OBS At the point of initial evaluation, it is my clinical opinion that admission for OBSERVATION is reasonable and necessary because the patient's presenting complaints in the context of their chronic conditions represent sufficient risk of deterioration or significant morbidity to constitute reasonable grounds for close observation in the hospital setting, but that the patient may be medically stable for discharge from the hospital within 24 to 48 hours.    Medical decision making: Patient seen at 1:00 AM on 05/04/2016.  What exists of the patient's chart was reviewed in depth and summarized above.  Clinical condition: improving HR, BP stable, mentation good.        Edwin Dada [facility] Pager [phone]

## 2016-05-04 NOTE — Progress Notes (Signed)
Patient reminded to use urinal so we can measure urine.

## 2016-05-04 NOTE — Progress Notes (Signed)
Pt refusing CPAP at this time. I told him to call if he changes his mind.  

## 2016-05-05 DIAGNOSIS — L03115 Cellulitis of right lower limb: Principal | ICD-10-CM

## 2016-05-05 DIAGNOSIS — E1165 Type 2 diabetes mellitus with hyperglycemia: Secondary | ICD-10-CM

## 2016-05-05 DIAGNOSIS — I10 Essential (primary) hypertension: Secondary | ICD-10-CM

## 2016-05-05 DIAGNOSIS — G4733 Obstructive sleep apnea (adult) (pediatric): Secondary | ICD-10-CM

## 2016-05-05 DIAGNOSIS — Z9989 Dependence on other enabling machines and devices: Secondary | ICD-10-CM

## 2016-05-05 LAB — HEMOGLOBIN A1C
Hgb A1c MFr Bld: 10.7 % — ABNORMAL HIGH (ref 4.8–5.6)
Mean Plasma Glucose: 260 mg/dL

## 2016-05-05 LAB — GLUCOSE, CAPILLARY
GLUCOSE-CAPILLARY: 240 mg/dL — AB (ref 65–99)
GLUCOSE-CAPILLARY: 247 mg/dL — AB (ref 65–99)
GLUCOSE-CAPILLARY: 229 mg/dL — AB (ref 65–99)
GLUCOSE-CAPILLARY: 232 mg/dL — AB (ref 65–99)

## 2016-05-05 MED ORDER — INSULIN GLARGINE 100 UNIT/ML ~~LOC~~ SOLN
18.0000 [IU] | Freq: Every day | SUBCUTANEOUS | Status: DC
  Administered 2016-05-05: 18 [IU] via SUBCUTANEOUS
  Filled 2016-05-05: qty 0.18

## 2016-05-05 NOTE — Progress Notes (Signed)
PROGRESS NOTE    [name]  ZOX:[id number] DOB: March 08, 1980 DOA: 05/03/2016 PCP: Tonye Pearson, MD (Inactive)    Brief Narrative: [name] is a 36 y.o. male with a past medical history significant for MO, OSA on CPAP, venous insufficiency and recurrent cellulitis, NIDDM and HTN who presents with cellulitis.   Assessment & Plan:   Principal Problem:   Cellulitis of right lower leg Active Problems:   Morbid obesity (HCC)   OSA on CPAP   Uncontrolled type 2 diabetes mellitus with hyperglycemia, without long-term current use of insulin (HCC)   Essential hypertension   Cellulitis, leg   Cellulitis of the right lower leg:  Improving erythema, tenderness. Swelling is persistent. Pt able to bear weight and ambulate.  Resume IV ancef for 24 hours and transition to oral antibiotics to complete the course.  Ambulate and elevate leg.    OSA ON CPAP  Uncontrolled DM;  CBG (last 3)   Recent Labs  05/05/16 0734 05/05/16 1201 05/05/16 1624  GLUCAP 240* 247* 229*    Not well controlled.  Increase the lantus to 18 units. hgba1c is 10.7   Mild normocytic anemia: stable at 12.    Hypertension: controlled.     DVT prophylaxis: (Lovenox) Code Status: (Full Family Communication: none at bedside.  Disposition Plan: home in am.    Consultants:   None.    Procedures: none.   Antimicrobials: ancef   Subjective: Pain controlled.   Objective: Vitals:   05/04/16 1700 05/04/16 2224 05/05/16 0434 05/05/16 1000  BP:  (!) 148/95 (!) 125/59 124/61  Pulse: 89 93 93 75  Resp: 20 20 18 18   Temp: 98.9 F (37.2 C) 98.8 F (37.1 C) 99 F (37.2 C) 98.4 F (36.9 C)  TempSrc: Oral Oral Oral Oral  SpO2: 95% 96% 93% 94%  Weight:  (!) 190 kg (418 lb 14.4 oz)    Height:        Intake/Output Summary (Last 24 hours) at 05/05/16 1503 Last data filed at 05/05/16 1300  Gross per 24 hour  Intake             1380 ml  Output             1800 ml  Net             -420  ml   Filed Weights   05/03/16 1427 05/03/16 2311 05/04/16 2224  Weight: (!) 194.1 kg (428 lb) (!) 186.1 kg (410 lb 4.4 oz) (!) 190 kg (418 lb 14.4 oz)    Examination:  General exam: Appears calm and comfortable  Respiratory system: Clear to auscultation. Respiratory effort normal. Cardiovascular system: S1 & S2 heard, RRR. No JVD, murmurs, rubs, gallops or clicks. No pedal edema. Gastrointestinal system: Abdomen is nondistended, soft and nontender. No organomegaly or masses felt. Normal bowel sounds heard. Central nervous system: Alert and oriented. No focal neurological deficits. Extremities: lower extremities erythema and tenderness. 2 + chronic edema.     Data Reviewed: I have personally reviewed following labs and imaging studies  CBC:  Recent Labs Lab 05/03/16 1505 05/04/16 0528  WBC 9.6 8.3  NEUTROABS 6.5  --   HGB 13.1 12.2*  HCT 38.0* 36.6*  MCV 88.6 88.6  PLT 197 177   Basic Metabolic Panel:  Recent Labs Lab 05/03/16 1505 05/04/16 0528  NA 135 135  K 4.1 4.1  CL 100* 101  CO2 27 24  GLUCOSE 314* 344*  BUN 14 10  CREATININE 0.64 0.62  CALCIUM 9.3 8.8*   GFR: Estimated Creatinine Clearance: 215.8 mL/min (by C-G formula based on SCr of 0.62 mg/dL). Liver Function Tests:  Recent Labs Lab 05/03/16 1505  AST 27  ALT 43  ALKPHOS 64  BILITOT 2.1*  PROT 7.6  ALBUMIN 3.6   No results for input(s): LIPASE, AMYLASE in the last 168 hours. No results for input(s): AMMONIA in the last 168 hours. Coagulation Profile: No results for input(s): INR, PROTIME in the last 168 hours. Cardiac Enzymes: No results for input(s): CKTOTAL, CKMB, CKMBINDEX, TROPONINI in the last 168 hours. BNP (last 3 results) No results for input(s): PROBNP in the last 8760 hours. HbA1C:  Recent Labs  05/04/16 0528  HGBA1C 10.7*   CBG:  Recent Labs Lab 05/04/16 1145 05/04/16 1648 05/04/16 2217 05/05/16 0734 05/05/16 1201  GLUCAP 330* 212* 204* 240* 247*   Lipid  Profile: No results for input(s): CHOL, HDL, LDLCALC, TRIG, CHOLHDL, LDLDIRECT in the last 72 hours. Thyroid Function Tests: No results for input(s): TSH, T4TOTAL, FREET4, T3FREE, THYROIDAB in the last 72 hours. Anemia Panel: No results for input(s): VITAMINB12, FOLATE, FERRITIN, TIBC, IRON, RETICCTPCT in the last 72 hours. Sepsis Labs:  Recent Labs Lab 05/03/16 1505 05/03/16 1742  LATICACIDVEN 1.07 0.89    Recent Results (from the past 240 hour(s))  Blood culture (routine x 2)     Status: None (Preliminary result)   Collection Time: 05/03/16  3:00 PM  Result Value Ref Range Status   Specimen Description BLOOD RIGHT AC  Final   Special Requests BOTTLES DRAWN AEROBIC AND ANAEROBIC 5ML EACH  Final   Culture   Final    NO GROWTH < 24 HOURS Performed at [facility], [address] [place], [place] [address]    Report Status PENDING  Incomplete  Blood culture (routine x 2)     Status: None (Preliminary result)   Collection Time: 05/03/16  3:25 PM  Result Value Ref Range Status   Specimen Description BLOOD LEFT AC  Final   Special Requests BOTTLES DRAWN AEROBIC AND ANAEROBIC 5ML EACH  Final   Culture   Final    NO GROWTH < 24 HOURS Performed at [facility], [address] [place], [place] [address]    Report Status PENDING  Incomplete         Radiology Studies: [place] Venous Img Lower Unilateral Right  Result Date: 05/03/2016 CLINICAL DATA:  Lower extremity pain and edema EXAM: RIGHT LOWER EXTREMITY VENOUS DUPLEX ULTRASOUND TECHNIQUE: Gray-scale sonography with graded compression, as well as color Doppler and duplex ultrasound were performed to evaluate the right lower extremity deep venous system from the level of the common femoral vein and including the common femoral, femoral, profunda femoral, popliteal and calf veins including the posterior tibial, peroneal and gastrocnemius veins when visible. The superficial great saphenous vein was also interrogated.  Spectral Doppler was utilized to evaluate flow at rest and with distal augmentation maneuvers in the common femoral, femoral and popliteal veins. COMPARISON:  None. FINDINGS: Contralateral Common Femoral Vein: Respiratory phasicity is normal and symmetric with the symptomatic side. No evidence of thrombus. Normal compressibility. Common Femoral Vein: No evidence of thrombus. Normal compressibility, respiratory phasicity and response to augmentation. Saphenofemoral Junction: No evidence of thrombus. Normal compressibility and flow on color Doppler imaging. Profunda Femoral Vein: No evidence of thrombus. Normal compressibility and flow on color Doppler imaging. Femoral Vein: No evidence of thrombus. Normal compressibility, respiratory phasicity and response to  augmentation. Popliteal Vein: No evidence of thrombus. Normal compressibility, respiratory phasicity and response to augmentation. Calf Veins: No evidence of thrombus. Normal compressibility and flow on color Doppler imaging. Superficial Great Saphenous Vein: No evidence of thrombus. Normal compressibility and flow on color Doppler imaging. Venous Reflux:  None. Other Findings:  None. IMPRESSION: No evidence of right lower extremity deep venous thrombosis. Left common femoral vein also patent. Electronically Signed   By: Bretta Bang III M.D.   On: 05/03/2016 16:59        Scheduled Meds: . atorvastatin  20 mg Oral q1800  .  ceFAZolin (ANCEF) IV  2 g Intravenous Q8H  . enoxaparin (LOVENOX) injection  90 mg Subcutaneous Daily  . insulin aspart  0-20 Units Subcutaneous TID WC  . insulin aspart  0-5 Units Subcutaneous QHS  . insulin glargine  15 Units Subcutaneous QHS   Continuous Infusions:   LOS: 1 day    Time spent: 30 minutes.    Kathlen Mody, MD [facility] Pager [phone]   If 7PM-7AM, please contact night-coverage [email or web address] Password [email or web address] 05/05/2016, 3:03 PM

## 2016-05-06 LAB — GLUCOSE, CAPILLARY
GLUCOSE-CAPILLARY: 199 mg/dL — AB (ref 65–99)
Glucose-Capillary: 253 mg/dL — ABNORMAL HIGH (ref 65–99)

## 2016-05-06 MED ORDER — INSULIN GLARGINE 100 UNIT/ML ~~LOC~~ SOLN
18.0000 [IU] | Freq: Every day | SUBCUTANEOUS | 11 refills | Status: DC
Start: 2016-05-06 — End: 2016-05-06

## 2016-05-06 MED ORDER — INSULIN GLARGINE 100 UNIT/ML ~~LOC~~ SOLN
18.0000 [IU] | Freq: Every day | SUBCUTANEOUS | 0 refills | Status: DC
Start: 2016-05-06 — End: 2016-05-06

## 2016-05-06 MED ORDER — GLIMEPIRIDE 2 MG PO TABS: 2.0000 mg | ORAL_TABLET | Freq: Every day | ORAL | 0 refills | Status: DC

## 2016-05-06 MED ORDER — CLINDAMYCIN HCL 300 MG PO CAPS: 300.0000 mg | ORAL_CAPSULE | Freq: Three times a day (TID) | ORAL | 0 refills | Status: DC

## 2016-05-07 NOTE — Discharge Summary (Signed)
Physician Discharge Summary  [name] ZOX:[id number] DOB: January 27, 1981 DOA: 05/03/2016  PCP: Tonye Pearson, MD (Inactive)  Admit date: 05/03/2016 Discharge date: 05/07/2016  Admitted From: Home Disposition:  HOme.   Recommendations for Outpatient Follow-up:  1. Follow up with PCP in 1-2 weeks 2. Please obtain BMP/CBC in one week 3. Please follow up on the following pending results:   Discharge Condition:stable CODE STATUS full code Diet recommendation: Heart Healthy / Carb Modified /  Brief/Interim Summary: Carl Maltos Robsonis a 36 y.o.malewith a past medical history significant for MO, OSA on CPAP, venous insufficiency and recurrent cellulitis, NIDDM and HTNwho presents with cellulitis.  Discharge Diagnoses:  Principal Problem:   Cellulitis of right lower leg Active Problems:   Morbid obesity (HCC)   OSA on CPAP   Uncontrolled type 2 diabetes mellitus with hyperglycemia, without long-term current use of insulin (HCC)   Essential hypertension   Cellulitis, leg  Cellulitis of the right lower leg:  Improving erythema, tenderness. Swelling is persistent. Pt able to bear weight and ambulate.   transition to oral antibiotics to complete the course on discharge.  Ambulate and elevate leg.    OSA ON CPAP  Uncontrolled DM;  CBG (last 3)   Recent Labs (last 2 labs)    Recent Labs  05/05/16 0734 05/05/16 1201 05/05/16 1624  GLUCAP 240* 247* 229*      Not well controlled.  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 amaryl prescribed.    Mild normocytic anemia: stable at 12.    Hypertension: controlled.      Discharge Instructions  Discharge Instructions    Diet - low sodium heart healthy    Complete by:  As directed    Discharge instructions    Complete by:  As directed    Please follow up with PCP in one week.     Allergies as of 05/06/2016      Reactions    Penicillins Rash   Has patient had a PCN reaction causing immediate rash, facial/tongue/throat swelling, SOB or lightheadedness with hypotension: Yes Has patient had a PCN reaction causing severe rash involving mucus membranes or skin necrosis: No Has patient had a PCN reaction that required hospitalization No Has patient had a PCN reaction occurring within the last 10 years: No If all of the above answers are "NO", then may proceed with Cephalosporin use. Tolerates cephalosporins      Medication List    STOP taking these medications   levofloxacin 500 MG tablet Commonly known as:  LEVAQUIN     TAKE these medications   atorvastatin 20 MG tablet Commonly known as:  LIPITOR TAKE ONE TABLET BY MOUTH ONCE DAILY. What changed:  how much to take  how to take this  when to take this  additional instructions   clindamycin 300 MG capsule Commonly known as:  CLEOCIN Take 1 capsule (300 mg total) by mouth 3 (three) times daily.   glimepiride 2 MG tablet Commonly known as:  AMARYL Take 1 tablet (2 mg total) by mouth daily with breakfast.   lisinopril 10 MG tablet Commonly known as:  PRINIVIL,ZESTRIL Take 1 tablet (10 mg total) by mouth daily.   metFORMIN 1000 MG tablet Commonly known as:  GLUCOPHAGE TAKE ONE TABLET BY MOUTH TWICE DAILY WITH A MEAL. What changed:  how much to take  how to take this  when to take this  additional instructions   multivitamin with minerals Tabs tablet Take 1 tablet by mouth daily.      Follow-up Information    DOOLITTLE, Harrel Lemon, MD. Schedule an appointment as soon as possible for a visit in 1 week(s).   Specialties:  Internal Medicine, Adolescent Medicine         Allergies  Allergen Reactions  . Penicillins Rash    Has patient had a PCN reaction causing immediate rash, facial/tongue/throat swelling, SOB or lightheadedness with hypotension: Yes Has patient had a PCN reaction causing severe rash involving mucus membranes or  skin necrosis: No Has patient had a PCN reaction that required hospitalization No Has patient had a PCN reaction occurring within the last 10 years: No If all of the above answers are "NO", then may proceed with Cephalosporin use.  Tolerates cephalosporins    Consultations: none  Procedures/Studies: Dg Chest 2 View  Result Date: 05/03/2016 CLINICAL DATA:  Pain and redness to RIGHT lower leg since yesterday, history cellulitis, diabetes mellitus, hypertension EXAM: CHEST  2 VIEW COMPARISON:  None FINDINGS: Normal heart size, mediastinal contours, and pulmonary vascularity. Lungs grossly clear. No definite infiltrate, pleural effusion or pneumothorax. Bones unremarkable. IMPRESSION: No acute abnormalities. Electronically Signed   By: Ulyses Southward M.D.   On: 05/03/2016 14:54   US Venous Img Lower Unilateral Right  Result Date: 05/03/2016 CLINICAL DATA:  Lower extremity pain and edema EXAM: RIGHT LOWER EXTREMITY VENOUS DUPLEX ULTRASOUND TECHNIQUE: Gray-scale sonography with graded compression, as well as color Doppler and duplex ultrasound were performed to evaluate the right lower extremity deep venous system from the level of the common femoral vein and including the common femoral, femoral, profunda femoral, popliteal and calf veins including the posterior tibial, peroneal and gastrocnemius veins when visible. The superficial great saphenous vein was also interrogated. Spectral Doppler was utilized to evaluate flow at rest and with distal augmentation maneuvers in the common femoral, femoral and popliteal veins. COMPARISON:  None. FINDINGS: Contralateral Common Femoral Vein: Respiratory phasicity is normal and symmetric with the symptomatic side. No evidence of thrombus. Normal compressibility. Common Femoral Vein: No evidence of thrombus. Normal compressibility, respiratory phasicity and response to augmentation. Saphenofemoral Junction: No evidence of thrombus. Normal compressibility and flow on  color Doppler imaging. Profunda Femoral Vein: No evidence of thrombus. Normal compressibility and flow on color Doppler imaging. Femoral Vein: No evidence of thrombus. Normal compressibility, respiratory phasicity and response to augmentation. Popliteal Vein: No evidence of thrombus. Normal compressibility, respiratory phasicity and response to augmentation. Calf Veins: No evidence of thrombus. Normal compressibility and flow on color Doppler imaging. Superficial Great Saphenous Vein: No evidence of thrombus. Normal compressibility and flow on color Doppler imaging. Venous Reflux:  None. Other Findings:  None. IMPRESSION: No evidence of right lower extremity deep venous thrombosis. Left common femoral vein also patent. Electronically Signed   By: Bretta Bang III M.D.   On: 05/03/2016 16:59       Subjective: No new complaints.  Discharge Exam: Vitals:   05/06/16 0507 05/06/16 0958  BP: (!) 111/50 (!) 138/91  Pulse: 85 90  Resp: 18 17  Temp: 98.9 F (37.2 C) 98.3 F (36.8 C)   Vitals:   05/05/16 1718 05/05/16 2110 05/06/16 0507 05/06/16 0958  BP: 137/85 139/80 (!) 111/50 (!) 138/91  Pulse: 89 94 85 90  Resp: 18 18 18 17   Temp: 98 F (36.7 C) 99 F (37.2 C) 98.9 F (37.2 C) 98.3 F (36.8 C)  TempSrc: Oral Oral Oral  Oral  SpO2: 96% 94% 97% 99%  Weight:  (!) 189.1 kg (416 lb 14.4 oz)    Height:        General: Pt is alert, awake, not in acute distress Cardiovascular: RRR, S1/S2 +, no rubs, no gallops Respiratory: CTA bilaterally, no wheezing, no rhonchi Abdominal: Soft, NT, ND, bowel sounds + Extremities: no edema, no cyanosis    The results of significant diagnostics from this hospitalization (including imaging, microbiology, ancillary and laboratory) are listed below for reference.     Microbiology: Recent Results (from the past 240 hour(s))  Blood culture (routine x 2)     Status: None (Preliminary result)   Collection Time: 05/03/16  3:00 PM  Result Value Ref  Range Status   Specimen Description BLOOD RIGHT [facility]  Final   Special Requests BOTTLES DRAWN AEROBIC AND ANAEROBIC 5ML EACH  Final   Culture   Final    NO GROWTH 3 DAYS Performed at [facility], [address] [place], [place] [address]    Report Status PENDING  Incomplete  Blood culture (routine x 2)     Status: None (Preliminary result)   Collection Time: 05/03/16  3:25 PM  Result Value Ref Range Status   Specimen Description BLOOD LEFT AC  Final   Special Requests BOTTLES DRAWN AEROBIC AND ANAEROBIC 5ML EACH  Final   Culture   Final    NO GROWTH 3 DAYS Performed at [facility], [address] [place], [place] [address]    Report Status PENDING  Incomplete     Labs: BNP (last 3 results)  Recent Labs  05/03/16 1505  BNP 43.6   Basic Metabolic Panel:  Recent Labs Lab 05/03/16 1505 05/04/16 0528  NA 135 135  K 4.1 4.1  CL 100* 101  CO2 27 24  GLUCOSE 314* 344*  BUN 14 10  CREATININE 0.64 0.62  CALCIUM 9.3 8.8*   Liver Function Tests:  Recent Labs Lab 05/03/16 1505  AST 27  ALT 43  ALKPHOS 64  BILITOT 2.1*  PROT 7.6  ALBUMIN 3.6   No results for input(s): LIPASE, AMYLASE in the last 168 hours. No results for input(s): AMMONIA in the last 168 hours. CBC:  Recent Labs Lab 05/03/16 1505 05/04/16 0528  WBC 9.6 8.3  NEUTROABS 6.5  --   HGB 13.1 12.2*  HCT 38.0* 36.6*  MCV 88.6 88.6  PLT 197 177   Cardiac Enzymes: No results for input(s): CKTOTAL, CKMB, CKMBINDEX, TROPONINI in the last 168 hours. BNP: Invalid input(s): POCBNP CBG:  Recent Labs Lab 05/05/16 1201 05/05/16 1624 05/05/16 2104 05/06/16 0806 05/06/16 1209  GLUCAP 247* 229* 232* 199* 253*   D-Dimer No results for input(s): DDIMER in the last 72 hours. Hgb A1c No results for input(s): HGBA1C in the last 72 hours. Lipid Profile No results for input(s): CHOL, HDL, LDLCALC, TRIG, CHOLHDL, LDLDIRECT in the last 72 hours. Thyroid function studies No results for  input(s): TSH, T4TOTAL, T3FREE, THYROIDAB in the last 72 hours.  Invalid input(s): FREET3 Anemia work up No results for input(s): VITAMINB12, FOLATE, FERRITIN, TIBC, IRON, RETICCTPCT in the last 72 hours. Urinalysis    Component Value Date/Time   COLORURINE YELLOW 05/03/2016 1615   APPEARANCEUR CLEAR 05/03/2016 1615   LABSPEC 1.024 05/03/2016 1615   PHURINE 6.0 05/03/2016 1615   GLUCOSEU >=500 (A) 05/03/2016 1615   HGBUR NEGATIVE 05/03/2016 1615   BILIRUBINUR NEGATIVE 05/03/2016 1615   KETONESUR NEGATIVE 05/03/2016 1615   PROTEINUR  30 (A) 05/03/2016 1615   UROBILINOGEN 1.0 06/08/2010 2140   NITRITE NEGATIVE 05/03/2016 1615   LEUKOCYTESUR NEGATIVE 05/03/2016 1615   Sepsis Labs Invalid input(s): PROCALCITONIN,  WBC,  LACTICIDVEN Microbiology Recent Results (from the past 240 hour(s))  Blood culture (routine x 2)     Status: None (Preliminary result)   Collection Time: 05/03/16  3:00 PM  Result Value Ref Range Status   Specimen Description BLOOD RIGHT [facility]  Final   Special Requests BOTTLES DRAWN AEROBIC AND ANAEROBIC EACH  Final   Culture   Final    NO GROWTH 3 DAYS Performed at [facility], [address] [place], [place] [address]    Report Status PENDING  Incomplete  Blood culture (routine x 2)     Status: None (Preliminary result)   Collection Time: 05/03/16  3:25 PM  Result Value Ref Range Status   Specimen Description BLOOD LEFT AC  Final   Special Requests BOTTLES DRAWN AEROBIC AND ANAEROBIC EACH  Final   Culture   Final    NO GROWTH 3 DAYS Performed at [facility], [address], [place], [place] [address]    Report Status PENDING  Incomplete     Time coordinating discharge: Over 30 minutes  SIGNED:   Kathlen Mody, MD  [facility] 05/07/2016, 9:22 AM Pager   If 7PM-7AM, please contact night-coverage [email or web address] Password [email or web address]

## 2016-05-08 LAB — CULTURE, BLOOD (ROUTINE X 2)
CULTURE: NO GROWTH
CULTURE: NO GROWTH

## 2016-05-14 ENCOUNTER — Inpatient Hospital Stay: Payer: BLUE CROSS/BLUE SHIELD

## 2016-08-05 DIAGNOSIS — L0201 Cutaneous abscess of face: Secondary | ICD-10-CM | POA: Diagnosis not present

## 2016-08-23 ENCOUNTER — Encounter: Payer: Self-pay | Admitting: Family Medicine

## 2016-08-23 ENCOUNTER — Ambulatory Visit (INDEPENDENT_AMBULATORY_CARE_PROVIDER_SITE_OTHER): Payer: BLUE CROSS/BLUE SHIELD | Admitting: Family Medicine

## 2016-08-23 VITALS — BP 149/83 | HR 87 | Temp 98.5°F | Resp 18 | Ht 69.0 in | Wt >= 6400 oz

## 2016-08-23 DIAGNOSIS — E1165 Type 2 diabetes mellitus with hyperglycemia: Secondary | ICD-10-CM

## 2016-08-23 DIAGNOSIS — E119 Type 2 diabetes mellitus without complications: Secondary | ICD-10-CM

## 2016-08-23 LAB — POCT GLYCOSYLATED HEMOGLOBIN (HGB A1C): HEMOGLOBIN A1C: 6.3

## 2016-08-23 MED ORDER — METFORMIN HCL 1000 MG PO TABS: ORAL_TABLET | ORAL | 1 refills | Status: DC

## 2016-08-23 MED ORDER — GLIMEPIRIDE 2 MG PO TABS: 2.0000 mg | ORAL_TABLET | Freq: Every day | ORAL | 1 refills | Status: DC

## 2016-08-23 MED ORDER — LISINOPRIL 10 MG PO TABS: 10.0000 mg | ORAL_TABLET | Freq: Every day | ORAL | 1 refills | Status: DC

## 2016-08-23 MED ORDER — ATORVASTATIN CALCIUM 20 MG PO TABS: ORAL_TABLET | ORAL | 1 refills | Status: DC

## 2016-08-23 NOTE — Progress Notes (Signed)
[name] is a 36 y.o. male who presents to Primary Care at [facility] today for Diabetes recheck:  1. Diabetes:  Currently on Metformin and Glimepiride.  Has been out of the Glimepiride for "past several weeks."  Also out of his ACE-I and lipitor for roughly same period of time.  Has been taking his metformin as prescribed.    Had recent cellulitis, during which he states his CBGs ran to 300s.  Since then, he reports CBGs of low to mid 100s.  Denies any adverse effects from medication.  Never had a hypoglycemic events.  No paresthesias or peripheral nerve pain.  No polyuria/polydipsia.  Lab Results  Component Value Date   HGBA1C 10.7 (H) 05/04/2016     ROS as above.  Pertinently, no chest pain, palpitations, SOB, Fever, Chills, Abd pain, N/V/D.   PMH reviewed. Patient is a nonsmoker.   Past Medical History:  Diagnosis Date  . Cellulitis   . Diabetes mellitus without complication (HCC)   . High cholesterol   . Hypertension   . Sleep apnea    Past Surgical History:  Procedure Laterality Date  . none      Medications reviewed. Current Outpatient Prescriptions  Medication Sig Dispense Refill  . atorvastatin (LIPITOR) 20 MG tablet TAKE ONE TABLET BY MOUTH ONCE DAILY. (Patient taking differently: Take 20 mg by mouth daily. ) 90 tablet 3  . glimepiride (AMARYL) 2 MG tablet Take 1 tablet (2 mg total) by mouth daily with breakfast. 30 tablet 0  . lisinopril (PRINIVIL,ZESTRIL) 10 MG tablet Take 1 tablet (10 mg total) by mouth daily. 90 tablet 3  . metFORMIN (GLUCOPHAGE) 1000 MG tablet TAKE ONE TABLET BY MOUTH TWICE DAILY WITH A MEAL. (Patient taking differently: Take 1,000 mg by mouth 2 (two) times daily with a meal. ) 180 tablet 3  . Multiple Vitamin (MULTIVITAMIN WITH MINERALS) TABS tablet Take 1 tablet by mouth daily.    . clindamycin (CLEOCIN) 300 MG capsule Take 1 capsule (300 mg total) by mouth 3 (three) times daily. (Patient not taking: Reported on 08/23/2016) 21 capsule 0   No  current facility-administered medications for this visit.      Physical Exam:  BP (!) 149/83   Pulse 87   Temp 98.5 F (36.9 C) (Oral)   Resp 18   Ht 5\' 9"  (1.753 m)   Wt (!) 409 lb 9.6 oz (185.8 kg)   SpO2 98%   BMI 60.49 kg/m  Gen:  Alert, cooperative patient who appears stated age in no acute distress.  Vital signs reviewed. HEENT: EOMI,  MMM  Pulm:  Clear to auscultation bilaterally with good air movement.  No wheezes or rales noted.   Cardiac:  Regular rate and rhythm without murmur auscultated.  Good S1/S2. Abd:  Obese.  Nontender.  Soft Exts: +4 nonpitting edema BL LE's.  With the beginnings of chronic venous changes.   Foot exam: No deformities, ulcerations, or other skin breakdown BL feet.  Sensation intact to monofilament and light touch.  DP pulses intact BL.     Assessment and Plan:  1.  Diabetes: - A1C actually controlled today, despite being out of his medicines for past several weeks - continue metformin and ACE-I - FU in 3 - 6 months to assess for sustained improvement - foot check performed today.   2.  HLD: - rechecking lipid panel today - refilled statin  3. LE edema: - with recurrent cellulitis - no evidence of cellulitis currently - he  has compression stockings -- but obviously not wearing them  4. 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  Poor eating/activity habits.

## 2016-08-23 NOTE — Patient Instructions (Addendum)
It was good to meet you today.  I have refilled all of your medicines for you.  Your A1C is 6.3.  This is much better than back in March!  Keep cutting back on carbs like you have been and increasing your activity levels.  We are checking labs and will let you know these results through Mychart.      IF you received an x-ray today, you will receive an invoice from [facility]. Please contact [facility] at [phone] with questions or concerns regarding your invoice.   IF you received labwork today, you will receive an invoice from [facility]. Please contact [facility] at [phone] with questions or concerns regarding your invoice.   Our billing staff will not be able to assist you with questions regarding bills from these companies.  You will be contacted with the lab results as soon as they are available. The fastest way to get your results is to activate your My Chart account. Instructions are located on the last page of this paperwork. If you have not heard from us regarding the results in 2 weeks, please contact this office.

## 2016-08-24 LAB — CBC
HEMATOCRIT: 43.9 % (ref 37.5–51.0)
Hemoglobin: 14.6 g/dL (ref 13.0–17.7)
MCH: 29.9 pg (ref 26.6–33.0)
MCHC: 33.3 g/dL (ref 31.5–35.7)
MCV: 90 fL (ref 79–97)
PLATELETS: 243 10*3/uL (ref 150–379)
RBC: 4.89 x10E6/uL (ref 4.14–5.80)
RDW: 14.9 % (ref 12.3–15.4)
WBC: 9.4 10*3/uL (ref 3.4–10.8)

## 2016-08-24 LAB — COMPREHENSIVE METABOLIC PANEL
ALBUMIN: 4.4 g/dL (ref 3.5–5.5)
ALK PHOS: 69 IU/L (ref 39–117)
ALT: 44 IU/L (ref 0–44)
AST: 30 IU/L (ref 0–40)
Albumin/Globulin Ratio: 1.5 (ref 1.2–2.2)
BUN / CREAT RATIO: 25 — AB (ref 9–20)
BUN: 16 mg/dL (ref 6–20)
Bilirubin Total: 1.3 mg/dL — ABNORMAL HIGH (ref 0.0–1.2)
CHLORIDE: 102 mmol/L (ref 96–106)
CO2: 25 mmol/L (ref 20–29)
Calcium: 10.2 mg/dL (ref 8.7–10.2)
Creatinine, Ser: 0.63 mg/dL — ABNORMAL LOW (ref 0.76–1.27)
GFR, EST AFRICAN AMERICAN: 148 mL/min/{1.73_m2} (ref >59)
GFR, EST NON AFRICAN AMERICAN: 128 mL/min/{1.73_m2} (ref >59)
Globulin, Total: 2.9 g/dL (ref 1.5–4.5)
Glucose: 162 mg/dL — ABNORMAL HIGH (ref 65–99)
POTASSIUM: 5 mmol/L (ref 3.5–5.2)
SODIUM: 141 mmol/L (ref 134–144)
Total Protein: 7.3 g/dL (ref 6.0–8.5)

## 2016-08-24 LAB — LIPID PANEL
CHOL/HDL RATIO: 3.8 ratio (ref 0.0–5.0)
Cholesterol, Total: 134 mg/dL (ref 100–199)
HDL: 35 mg/dL — ABNORMAL LOW (ref >39)
LDL Calculated: 38 mg/dL (ref 0–99)
Triglycerides: 304 mg/dL — ABNORMAL HIGH (ref 0–149)
VLDL Cholesterol Cal: 61 mg/dL — ABNORMAL HIGH (ref 5–40)

## 2016-08-24 LAB — HM DIABETES EYE EXAM

## 2017-04-27 ENCOUNTER — Ambulatory Visit (HOSPITAL_COMMUNITY)
Admission: EM | Admit: 2017-04-27 | Discharge: 2017-04-27 | Disposition: A | Discharge status: Home / Self Care | Payer: Self-pay | Attending: Internal Medicine | Admitting: Internal Medicine

## 2017-04-27 ENCOUNTER — Other Ambulatory Visit: Payer: Self-pay

## 2017-04-27 ENCOUNTER — Encounter (HOSPITAL_COMMUNITY): Payer: Self-pay | Admitting: *Deleted

## 2017-04-27 DIAGNOSIS — J029 Acute pharyngitis, unspecified: Secondary | ICD-10-CM | POA: Insufficient documentation

## 2017-04-27 DIAGNOSIS — I10 Essential (primary) hypertension: Secondary | ICD-10-CM | POA: Insufficient documentation

## 2017-04-27 LAB — POCT RAPID STREP A: STREPTOCOCCUS, GROUP A SCREEN (DIRECT): NEGATIVE

## 2017-04-27 NOTE — ED Triage Notes (Signed)
C/O sore throat and postnasal drainage x 2 days with fevers.

## 2017-04-27 NOTE — Discharge Instructions (Signed)

## 2017-04-27 NOTE — ED Provider Notes (Signed)
[facility]    CSN: [id number] Arrival date & time: 04/27/17  1741     History   Chief Complaint No chief complaint on file.   HPI [name] is a 37 y.o. male history of hypertension, Patient is presenting with URI symptoms- congestion, sore throat.  Denies cough.  Also having postnasal drainage.  Patient also having subjective fevers.  patient's main complaints are drainage and sore throat. Symptoms have been going on for 2 days. Patient has tried NyQuil and ibuprofen, with minimal relief. Denies nausea, vomiting, diarrhea. Denies shortness of breath and chest pain.    HPI  Past Medical History:  Diagnosis Date  . Cellulitis   . Diabetes mellitus without complication (HCC)   . High cholesterol   . Hypertension   . Sleep apnea     Patient Active Problem List   Diagnosis Date Noted  . Essential hypertension 05/04/2016  . Cellulitis, leg 05/04/2016  . Cellulitis of right lower leg 05/03/2016  . OSA on CPAP 03/10/2014  . Uncontrolled type 2 diabetes mellitus with hyperglycemia, without long-term current use of insulin (HCC) 03/10/2014  . Morbid obesity (HCC) 03/01/2014    Past Surgical History:  Procedure Laterality Date  . debridement of leg    . none         Home Medications    Prior to Admission medications   Medication Sig Start Date End Date Taking? Authorizing Provider  atorvastatin (LIPITOR) 20 MG tablet TAKE ONE TABLET BY MOUTH ONCE DAILY. 08/23/16   Tobey GrimWalden, Jeffrey H, MD  glimepiride (AMARYL) 2 MG tablet Take 1 tablet (2 mg total) by mouth daily with breakfast. 08/23/16   Tobey GrimWalden, Jeffrey H, MD  lisinopril (PRINIVIL,ZESTRIL) 10 MG tablet Take 1 tablet (10 mg total) by mouth daily. 08/23/16   Tobey GrimWalden, Jeffrey H, MD  metFORMIN (GLUCOPHAGE) 1000 MG tablet TAKE ONE TABLET BY MOUTH TWICE DAILY WITH A MEAL. 08/23/16   Tobey GrimWalden, Jeffrey H, MD  Multiple Vitamin (MULTIVITAMIN WITH MINERALS) TABS tablet Take 1 tablet by mouth daily.    [provider]    Family History Family History  Problem Relation Age of Onset  . Diabetes Mother   . Diabetes Sister     Social History Social History   Tobacco Use  . Smoking status: Never Smoker  . Smokeless tobacco: Never Used  Substance Use Topics  . Alcohol use: Yes    Comment: occasionally  . Drug use: No     Allergies   Penicillins   Review of Systems Review of Systems  Constitutional: Positive for fatigue and fever. Negative for activity change and appetite change.  HENT: Positive for congestion, postnasal drip, rhinorrhea and sore throat. Negative for ear pain and sinus pressure.   Eyes: Negative for pain and itching.  Respiratory: Negative for cough and shortness of breath.   Cardiovascular: Negative for chest pain.  Gastrointestinal: Negative for abdominal pain, diarrhea, nausea and vomiting.  Musculoskeletal: Negative for myalgias.  Skin: Negative for rash.  Neurological: Negative for dizziness, light-headedness and headaches.     Physical Exam Triage Vital Signs ED Triage Vitals  Enc Vitals Group     BP 04/27/17 1907 131/64     Pulse Rate 04/27/17 1907 86     Resp 04/27/17 1907 20     Temp 04/27/17 1907 99.2 F (37.3 C)     Temp Source 04/27/17 1907 Oral     SpO2 04/27/17 1907 99 %     Weight --  Height --      Head Circumference --      Peak Flow --      Pain Score 04/27/17 1908 7     Pain Loc --      Pain Edu? --      Excl. in GC? --    No data found.  Updated Vital Signs BP 131/64   Pulse 86   Temp 99.2 F (37.3 C) (Oral)   Resp 20   SpO2 99%  Patient requested temperature recheck, 100.6 on recheck. Visual Acuity Right Eye Distance:   Left Eye Distance:   Bilateral Distance:    Right Eye Near:   Left Eye Near:    Bilateral Near:     Physical Exam  Constitutional: He appears well-developed and well-nourished.  HENT:  Head: Normocephalic and atraumatic.  Bilateral TMs nonerythematous, nasal mucosa erythematous with rhinorrhea  present.  Posterior oropharynx erythematous, mild tonsillar enlargement, one small white exudate on right tonsil, no exudate in posterior pharynx oral left tonsil.  Eyes: Conjunctivae are normal.  Neck: Neck supple.  No cervical lymphadenopathy  Cardiovascular: Normal rate and regular rhythm.  No murmur heard. Pulmonary/Chest: Effort normal and breath sounds normal. No respiratory distress.  Breathing comfortably at rest, sounds slightly congested, clear to auscultation bilaterally  Abdominal: Soft. There is no tenderness.  Musculoskeletal: He exhibits no edema.  Neurological: He is alert.  Skin: Skin is warm and dry.  Psychiatric: He has a normal mood and affect.  Nursing note and vitals reviewed.    UC Treatments / Results  Labs (all labs ordered are listed, but only abnormal results are displayed) Labs Reviewed  CULTURE, GROUP A STREP [facility])  POCT RAPID STREP A    EKG  EKG Interpretation None       Radiology No results found.  Procedures Procedures (including critical care time)  Medications Ordered in UC Medications - No data to display   Initial Impression / Assessment and Plan / UC Course  I have reviewed the triage vital signs and the nursing notes.  Pertinent labs & imaging results that were available during my care of the patient were reviewed by me and considered in my medical decision making (see chart for details).     Patient tested negative for strep. No evidence of peritonsillar abscess or retropharyngeal abscess. Patient is nontoxic appearing, no drooling, dysphagia, muffled voice, or tripoding. No trismus.  Will treat as viral URI.  Advised allergy pill and Flonase to help with drainage, ear pain.  Advised other symptomatic management of sore throat. Discussed strict return precautions. Patient verbalized understanding and is agreeable with plan.     Final Clinical Impressions(s) / UC Diagnoses   Final diagnoses:  Sore throat    ED  Discharge Orders    None       Controlled Substance Prescriptions [place] Controlled Substance Registry consulted? Not Applicable   Lew Dawes, [place] 04/27/17 1947

## 2017-04-30 LAB — CULTURE, GROUP A STREP (THRC)

## 2017-07-30 ENCOUNTER — Encounter: Payer: Self-pay | Admitting: Physician Assistant

## 2017-07-30 ENCOUNTER — Ambulatory Visit (INDEPENDENT_AMBULATORY_CARE_PROVIDER_SITE_OTHER): Payer: BLUE CROSS/BLUE SHIELD | Admitting: Physician Assistant

## 2017-07-30 VITALS — BP 130/80 | HR 80 | Temp 99.1°F | Resp 17 | Ht 69.0 in | Wt 347.0 lb

## 2017-07-30 DIAGNOSIS — I10 Essential (primary) hypertension: Secondary | ICD-10-CM | POA: Diagnosis not present

## 2017-07-30 DIAGNOSIS — E119 Type 2 diabetes mellitus without complications: Secondary | ICD-10-CM | POA: Diagnosis not present

## 2017-07-30 DIAGNOSIS — Z23 Encounter for immunization: Secondary | ICD-10-CM

## 2017-07-30 LAB — POCT GLYCOSYLATED HEMOGLOBIN (HGB A1C): Hemoglobin A1C: 4.9 % (ref 4.0–5.6)

## 2017-07-30 MED ORDER — METFORMIN HCL 1000 MG PO TABS: ORAL_TABLET | ORAL | 3 refills | Status: DC

## 2017-07-30 MED ORDER — ATORVASTATIN CALCIUM 20 MG PO TABS: ORAL_TABLET | ORAL | 3 refills | Status: DC

## 2017-07-30 MED ORDER — ATORVASTATIN CALCIUM 20 MG PO TABS: ORAL_TABLET | ORAL | 3 refills | Status: AC

## 2017-07-30 MED ORDER — METFORMIN HCL 1000 MG PO TABS: ORAL_TABLET | ORAL | 3 refills | Status: AC

## 2017-07-30 MED ORDER — LISINOPRIL 10 MG PO TABS: 10.0000 mg | ORAL_TABLET | Freq: Every day | ORAL | 3 refills | Status: DC

## 2017-07-30 MED ORDER — LISINOPRIL 10 MG PO TABS: 10.0000 mg | ORAL_TABLET | Freq: Every day | ORAL | 3 refills | Status: AC

## 2017-07-30 NOTE — Progress Notes (Signed)
07/30/2017 2:13 PM   DOB: May 04, 1980 / MRN: [id number]  SUBJECTIVE:  [name] is a 37 y.o. male presenting for diabetes follow up. This has been well controlled with metformin and amaryl.  On ACE and statin.   Patient has never smoked. No chest pain, SOB, DOE, sock and glove paresthesia. Struggles with his weight but has managed to lose about 60 lbs since his last visit.    Lab Results  Component Value Date   HGBA1C 4.9 07/30/2017     He is allergic to penicillins.   He  has a past medical history of Cellulitis, Diabetes mellitus without complication (HCC), High cholesterol, Hypertension, and Sleep apnea.    He  reports that he has never smoked. He has never used smokeless tobacco. He reports that he drinks alcohol. He reports that he does not use drugs. He  has no sexual activity history on file. The patient  has a past surgical history that includes none and debridement of leg.  His family history includes Diabetes in his mother and sister.  Review of Systems  Constitutional: Negative for chills, diaphoresis and fever.  HENT: Negative for hearing loss.   Eyes: Negative.  Negative for blurred vision, double vision and photophobia.  Respiratory: Negative for cough, hemoptysis, sputum production, shortness of breath and wheezing.   Cardiovascular: Negative for chest pain, orthopnea and leg swelling.  Gastrointestinal: Negative for abdominal pain, blood in stool, constipation, diarrhea, heartburn, melena, nausea and vomiting.  Genitourinary: Negative for dysuria, flank pain, frequency, hematuria and urgency.  Musculoskeletal: Negative for myalgias.  Skin: Negative for rash.  Neurological: Negative for dizziness, sensory change, speech change, focal weakness and headaches.  Endo/Heme/Allergies: Negative for polydipsia.    The problem list and medications were reviewed and updated by myself where necessary and exist elsewhere in the encounter.   OBJECTIVE:  BP 130/80    Pulse 80   Temp 99.1 F (37.3 C) (Oral)   Resp 17   Ht  (1.753 m)   Wt (!) 347 lb (157.4 kg)   SpO2 98%   BMI 51.24 kg/m   Wt Readings from Last 3 Encounters:  07/30/17 (!) 347 lb (157.4 kg)  08/23/16 (!) 409 lb 9.6 oz (185.8 kg)  05/05/16 (!) 416 lb 14.4 oz (189.1 kg)   Temp Readings from Last 3 Encounters:  07/30/17 99.1 F (37.3 C) (Oral)  04/27/17 99.2 F (37.3 C) (Oral)  08/23/16 98.5 F (36.9 C) (Oral)   BP Readings from Last 3 Encounters:  07/30/17 130/80  04/27/17 131/64  08/23/16 (!) 149/83   Pulse Readings from Last 3 Encounters:  07/30/17 80  04/27/17 86  08/23/16 87     Physical Exam  Constitutional: He is oriented to person, place, and time. He appears well-developed. He is active.  Non-toxic appearance. He does not appear ill.  HENT:  Head: Normocephalic and atraumatic.  Eyes: Pupils are equal, round, and reactive to light. Conjunctivae and EOM are normal.  Neck: No thyromegaly present.  Cardiovascular: Normal rate, regular rhythm, S1 normal, S2 normal, normal heart sounds, intact distal pulses and normal pulses. Exam reveals no gallop and no friction rub.  No murmur heard. Pulses:      Dorsalis pedis pulses are 2+ on the right side, and 2+ on the left side.       Posterior tibial pulses are 2+ on the right side, and 2+ on the left side.  Pulmonary/Chest: Effort normal. No stridor. No respiratory distress. He  has no wheezes. He has no rales. He exhibits no tenderness.  Abdominal: He exhibits no distension.  Musculoskeletal: Normal range of motion. He exhibits no edema.       Right foot: There is normal range of motion and no deformity.       Left foot: There is normal range of motion and no deformity.  Feet:  Right Foot:  Protective Sensation: 6 sites tested. 6 sites sensed.  Skin Integrity: Negative for ulcer, blister or dry skin.  Left Foot:  Protective Sensation: 6 sites tested. 6 sites sensed.  Skin Integrity: Negative for ulcer, blister  or dry skin.  Neurological: He is alert and oriented to person, place, and time. No cranial nerve deficit. Coordination normal.  Skin: Skin is warm and dry. He is not diaphoretic. No pallor.  Psychiatric: He has a normal mood and affect.  Nursing note and vitals reviewed.   Results for orders placed or performed in visit on 07/30/17 (from the past 72 hour(s))  POCT glycosylated hemoglobin (Hb A1C)     Status: None   Collection Time: 07/30/17  2:00 PM  Result Value Ref Range   Hemoglobin A1C 4.9 4.0 - 5.6 %   HbA1c, POC (prediabetic range)  5.7 - 6.4 %   HbA1c, POC (controlled diabetic range)  0.0 - 7.0 %    No results found.  ASSESSMENT AND PLAN:  [name] was seen today for diabetes.  Diagnoses and all orders for this visit:  Well controlled diabetes mellitus [facility]): He is doing exquisitely well.  Stopping the amaryl.  Cutting the metfomin in half.  Continuing statin and ACE.  Encouraged continued weight loss.  -     POCT glycosylated hemoglobin (Hb A1C) -     Comprehensive metabolic panel -     Lipid panel -     CBC -     HM Diabetes Foot Exam -     Microalbumin, urine -     lisinopril (PRINIVIL,ZESTRIL) 10 MG tablet; Take 1 tablet (10 mg total) by mouth daily. -     metFORMIN (GLUCOPHAGE) 1000 MG tablet; TAKE ONE TABLET BY MOUTH DAILY WITH A MEAL. -     atorvastatin (LIPITOR) 20 MG tablet; TAKE ONE TABLET BY MOUTH ONCE DAILY.  Need for diphtheria-tetanus-pertussis (Tdap) vaccine -     Tdap vaccine greater than or equal to 7yo IM  Need for prophylactic vaccination against Streptococcus pneumoniae (pneumococcus) -     Pneumococcal polysaccharide vaccine 23-valent greater than or equal to 2yo subcutaneous/IM  Essential hypertension -     lisinopril (PRINIVIL,ZESTRIL) 10 MG tablet; Take 1 tablet (10 mg total) by mouth daily.  Other orders -     Discontinue: lisinopril (PRINIVIL,ZESTRIL) 10 MG tablet; Take 1 tablet (10 mg total) by mouth daily. -     Discontinue: metFORMIN  (GLUCOPHAGE) 1000 MG tablet; TAKE ONE TABLET BY MOUTH DAILY WITH A MEAL. -     Discontinue: atorvastatin (LIPITOR) 20 MG tablet; TAKE ONE TABLET BY MOUTH ONCE DAILY.    The patient is advised to call or return to clinic if he does not see an improvement in symptoms, or to seek the care of the closest emergency department if he worsens with the above plan.   Deliah Boston, MHS, PA-C Primary Care at [facility] 07/30/2017 2:13 PM

## 2017-07-30 NOTE — Patient Instructions (Addendum)
Congratulations on your weight loss!  Come back in 6 months  Wt Readings from Last 3 Encounters:  07/30/17 (!) 347 lb (157.4 kg)  08/23/16 (!) 409 lb 9.6 oz (185.8 kg)  05/05/16 (!) 416 lb 14.4 oz (189.1 kg)   Please call your eye doctor for your annual diabetic eye exam.  You are well controlled today and have been in the past.  Please continue all of your medications as they are and come back in about 1 year.      IF you received an x-ray today, you will receive an invoice from [facility]. Please contact [facility] at [phone] with questions or concerns regarding your invoice.   IF you received labwork today, you will receive an invoice from [place]. Please contact [facility] at [phone] with questions or concerns regarding your invoice.   Our billing staff will not be able to assist you with questions regarding bills from these companies.  You will be contacted with the lab results as soon as they are available. The fastest way to get your results is to activate your My Chart account. Instructions are located on the last page of this paperwork. If you have not heard from [place] regarding the results in 2 weeks, please contact this office.     DTaP Vaccine (Diphtheria, Tetanus, and Pertussis): What You Need to Know 1. Why get vaccinated? Diphtheria, tetanus, and pertussis are serious diseases caused by bacteria. Diphtheria and pertussis are spread from person to person. Tetanus enters the body through cuts or wounds. DIPHTHERIA causes a thick covering in the back of the throat.  It can lead to breathing problems, paralysis, heart failure, and even death.  TETANUS (Lockjaw) causes painful tightening of the muscles, usually all over the body.  It can lead to "locking" of the jaw so the victim cannot open his mouth or swallow. Tetanus leads to death in up to 2 out of 10 cases.  PERTUSSIS (Whooping Cough) causes coughing spells so bad that it is hard for infants  to eat, drink, or breathe. These spells can last for weeks.  It can lead to pneumonia, seizures (jerking and staring spells), brain damage, and death.  Diphtheria, tetanus, and pertussis vaccine (DTaP) can help prevent these diseases. Most children who are vaccinated with DTaP will be protected throughout childhood. Many more children would get these diseases if we stopped vaccinating. DTaP is a safer version of an older vaccine called DTP. DTP is no longer used in the [place]. 2. Who should get DTaP vaccine and when? Children should get 5 doses of DTaP vaccine, one dose at each of the following ages:  2 months  4 months  6 months  15-18 months  4-6 years  DTaP may be given at the same time as other vaccines. 3. Some children should not get DTaP vaccine or should wait  Children with minor illnesses, such as a cold, may be vaccinated. But children who are moderately or severely ill should usually wait until they recover before getting DTaP vaccine.  Any child who had a life-threatening allergic reaction after a dose of DTaP should not get another dose.  Any child who suffered a brain or nervous system disease within 7 days after a dose of DTaP should not get another dose.  Talk with your doctor if your child: ? had a seizure or collapsed after a dose of DTaP, ? cried non-stop for 3 hours or more after a dose of DTaP, ? had a fever  over 105F after a dose of DTaP. Ask your doctor for more information. Some of these children should not get another dose of pertussis vaccine, but may get a vaccine without pertussis, called DT. 4. Older children and adults DTaP is not licensed for adolescents, adults, or children 2 years of age and older. But older people still need protection. A vaccine called Tdap is similar to DTaP. A single dose of Tdap is recommended for people 11 through 36 years of age. Another vaccine, called Td, protects against tetanus and diphtheria, but not pertussis.  It is recommended every 10 years. There are separate Vaccine Information Statements for these vaccines. 5. What are the risks from DTaP vaccine? Getting diphtheria, tetanus, or pertussis disease is much riskier than getting DTaP vaccine. However, a vaccine, like any medicine, is capable of causing serious problems, such as severe allergic reactions. The risk of DTaP vaccine causing serious harm, or death, is extremely small. Mild problems (common)  Fever (up to about 1 child in 4)  Redness or swelling where the shot was given (up to about 1 child in 4)  Soreness or tenderness where the shot was given (up to about 1 child in 4) These problems occur more often after the 4th and 5th doses of the DTaP series than after earlier doses. Sometimes the 4th or 5th dose of DTaP vaccine is followed by swelling of the entire arm or leg in which the shot was given, lasting 1-7 days (up to about 1 child in 30). Other mild problems include:  Fussiness (up to about 1 child in 3)  Tiredness or poor appetite (up to about 1 child in 10)  Vomiting (up to about 1 child in 50) These problems generally occur 1-3 days after the shot. Moderate problems (uncommon)  Seizure (jerking or staring) (about 1 child out of 14,000)  Non-stop crying, for 3 hours or more (up to about 1 child out of 1,000)  High fever, over 105F (about 1 child out of 16,000) Severe problems (very rare)  Serious allergic reaction (less than 1 out of a million doses)  Several other severe problems have been reported after DTaP vaccine. These include: ? Long-term seizures, coma, or lowered consciousness ? Permanent brain damage. These are so rare it is hard to tell if they are caused by the vaccine. Controlling fever is especially important for children who have had seizures, for any reason. It is also important if another family member has had seizures. You can reduce fever and pain by giving your child an aspirin-free pain reliever when  the shot is given, and for the next 24 hours, following the package instructions. 6. What if there is a serious reaction? What should I look for? Look for anything that concerns you, such as signs of a severe allergic reaction, very high fever, or behavior changes. Signs of a severe allergic reaction can include hives, swelling of the face and throat, difficulty breathing, a fast heartbeat, dizziness, and weakness. These would start a few minutes to a few hours after the vaccination. What should I do?  If you think it is a severe allergic reaction or other emergency that can't wait, call 9-1-1 or get the person to the nearest hospital. Otherwise, call your doctor.  Afterward, the reaction should be reported to the Vaccine Adverse Event Reporting System (VAERS). Your doctor might file this report, or you can do it yourself through the VAERS web site at www.vaers.LAgents.no, or by calling 1-7875832322. ? VAERS is only  for reporting reactions. They do not give medical advice. 7. The National Vaccine Injury Compensation Program The [employer] Vaccine Injury Compensation Program (VICP) is a federal program that was created to compensate people who may have been injured by certain vaccines. Persons who believe they may have been injured by a vaccine can learn about the program and about filing a claim by calling 1-938-155-4546 or visiting the VICP website at SpiritualWord.at. 8. How can I learn more?  Ask your doctor.  Call your local or state health department.  Contact the Centers for Disease Control and Prevention (CDC): ? Call 508-318-3696 (1-800-CDC-INFO) or ? Visit CDC's website at PicCapture.uy CDC DTaP Vaccine (Diphtheria, Tetanus, and Pertussis) VIS (07/19/05) This information is not intended to replace advice given to you by your health care provider. Make sure you discuss any questions you have with your health care provider. Document Released: 12/17/2005 Document  Revised: 11/10/2015 Document Reviewed: 11/10/2015 Elsevier Interactive Patient Education  2017 [employer].

## 2017-07-31 LAB — CBC
HEMATOCRIT: 45.2 % (ref 37.5–51.0)
HEMOGLOBIN: 15.6 g/dL (ref 13.0–17.7)
MCH: 31.2 pg (ref 26.6–33.0)
MCHC: 34.5 g/dL (ref 31.5–35.7)
MCV: 90 fL (ref 79–97)
Platelets: 256 10*3/uL (ref 150–450)
RBC: 5 x10E6/uL (ref 4.14–5.80)
RDW: 14 % (ref 12.3–15.4)
WBC: 8.3 10*3/uL (ref 3.4–10.8)

## 2017-07-31 LAB — COMPREHENSIVE METABOLIC PANEL
A/G RATIO: 1.6 (ref 1.2–2.2)
ALBUMIN: 4.6 g/dL (ref 3.5–5.5)
ALT: 15 IU/L (ref 0–44)
AST: 16 IU/L (ref 0–40)
Alkaline Phosphatase: 73 IU/L (ref 39–117)
BUN / CREAT RATIO: 32 — AB (ref 9–20)
BUN: 25 mg/dL — ABNORMAL HIGH (ref 6–20)
Bilirubin Total: 0.9 mg/dL (ref 0.0–1.2)
CO2: 24 mmol/L (ref 20–29)
CREATININE: 0.77 mg/dL (ref 0.76–1.27)
Calcium: 10.2 mg/dL (ref 8.7–10.2)
Chloride: 104 mmol/L (ref 96–106)
GFR calc Af Amer: 135 mL/min/{1.73_m2} (ref >59)
GFR, EST NON AFRICAN AMERICAN: 117 mL/min/{1.73_m2} (ref >59)
GLOBULIN, TOTAL: 2.9 g/dL (ref 1.5–4.5)
Glucose: 66 mg/dL (ref 65–99)
POTASSIUM: 4.8 mmol/L (ref 3.5–5.2)
SODIUM: 144 mmol/L (ref 134–144)
Total Protein: 7.5 g/dL (ref 6.0–8.5)

## 2017-07-31 LAB — LIPID PANEL
CHOL/HDL RATIO: 2.5 ratio (ref 0.0–5.0)
Cholesterol, Total: 102 mg/dL (ref 100–199)
HDL: 41 mg/dL (ref >39)
LDL CALC: 22 mg/dL (ref 0–99)
Triglycerides: 196 mg/dL — ABNORMAL HIGH (ref 0–149)
VLDL Cholesterol Cal: 39 mg/dL (ref 5–40)

## 2017-07-31 LAB — MICROALBUMIN, URINE: MICROALBUM., U, RANDOM: 137.3 ug/mL

## 2018-01-27 ENCOUNTER — Ambulatory Visit: Payer: BLUE CROSS/BLUE SHIELD | Admitting: Physician Assistant

## 2018-01-28 ENCOUNTER — Ambulatory Visit: Payer: BLUE CROSS/BLUE SHIELD | Admitting: Emergency Medicine

## 2018-01-31 ENCOUNTER — Ambulatory Visit: Payer: BLUE CROSS/BLUE SHIELD | Admitting: Emergency Medicine

## 2018-08-31 ENCOUNTER — Emergency Department (HOSPITAL_BASED_OUTPATIENT_CLINIC_OR_DEPARTMENT_OTHER)
Admission: EM | Admit: 2018-08-31 | Discharge: 2018-08-31 | Disposition: A | Discharge status: Home / Self Care | Payer: No Typology Code available for payment source | Attending: Emergency Medicine | Admitting: Emergency Medicine

## 2018-08-31 ENCOUNTER — Other Ambulatory Visit: Payer: Self-pay

## 2018-08-31 ENCOUNTER — Encounter (HOSPITAL_BASED_OUTPATIENT_CLINIC_OR_DEPARTMENT_OTHER): Payer: Self-pay | Admitting: Emergency Medicine

## 2018-08-31 ENCOUNTER — Emergency Department (HOSPITAL_BASED_OUTPATIENT_CLINIC_OR_DEPARTMENT_OTHER): Payer: No Typology Code available for payment source

## 2018-08-31 DIAGNOSIS — Y929 Unspecified place or not applicable: Secondary | ICD-10-CM | POA: Diagnosis not present

## 2018-08-31 DIAGNOSIS — Y9389 Activity, other specified: Secondary | ICD-10-CM | POA: Insufficient documentation

## 2018-08-31 DIAGNOSIS — S20212A Contusion of left front wall of thorax, initial encounter: Secondary | ICD-10-CM | POA: Insufficient documentation

## 2018-08-31 DIAGNOSIS — I10 Essential (primary) hypertension: Secondary | ICD-10-CM | POA: Diagnosis not present

## 2018-08-31 DIAGNOSIS — Z79899 Other long term (current) drug therapy: Secondary | ICD-10-CM | POA: Insufficient documentation

## 2018-08-31 DIAGNOSIS — Y999 Unspecified external cause status: Secondary | ICD-10-CM | POA: Diagnosis not present

## 2018-08-31 DIAGNOSIS — Z7984 Long term (current) use of oral hypoglycemic drugs: Secondary | ICD-10-CM | POA: Insufficient documentation

## 2018-08-31 DIAGNOSIS — E119 Type 2 diabetes mellitus without complications: Secondary | ICD-10-CM | POA: Diagnosis not present

## 2018-08-31 DIAGNOSIS — S299XXA Unspecified injury of thorax, initial encounter: Secondary | ICD-10-CM | POA: Diagnosis present

## 2018-08-31 MED ORDER — HYDROCODONE-ACETAMINOPHEN 5-325 MG PO TABS: 1.0000 | ORAL_TABLET | Freq: Four times a day (QID) | ORAL | 0 refills | Status: DC | PRN
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
## 2018-08-31 NOTE — Discharge Instructions (Addendum)
Hydrocodone as prescribed as needed for pain.  Follow-up with primary doctor if not improving in the next week, and return to the ER if you develop worsening pain, difficulty breathing, high fever, or other new and concerning symptoms.

## 2019-05-10 ENCOUNTER — Emergency Department (HOSPITAL_BASED_OUTPATIENT_CLINIC_OR_DEPARTMENT_OTHER)
Admission: EM | Admit: 2019-05-10 | Discharge: 2019-05-10 | Disposition: A | Discharge status: Home / Self Care | Payer: Self-pay | Attending: Emergency Medicine | Admitting: Emergency Medicine

## 2019-05-10 ENCOUNTER — Encounter (HOSPITAL_BASED_OUTPATIENT_CLINIC_OR_DEPARTMENT_OTHER): Payer: Self-pay | Admitting: Emergency Medicine

## 2019-05-10 ENCOUNTER — Other Ambulatory Visit: Payer: Self-pay

## 2019-05-10 DIAGNOSIS — G4733 Obstructive sleep apnea (adult) (pediatric): Secondary | ICD-10-CM | POA: Insufficient documentation

## 2019-05-10 DIAGNOSIS — J0110 Acute frontal sinusitis, unspecified: Secondary | ICD-10-CM | POA: Insufficient documentation

## 2019-05-10 DIAGNOSIS — Z9989 Dependence on other enabling machines and devices: Secondary | ICD-10-CM | POA: Insufficient documentation

## 2019-05-10 MED ORDER — DOXYCYCLINE HYCLATE 100 MG PO CAPS: 100.0000 mg | ORAL_CAPSULE | Freq: Two times a day (BID) | ORAL | 0 refills | Status: DC

## 2019-05-10 NOTE — ED Provider Notes (Signed)
[facility] EMERGENCY DEPARTMENT Provider Note   CSN: [id number] Arrival date & time: 05/10/19  1254     History Chief Complaint  Patient presents with  . Nasal Congestion    [name] is a 39 y.o. male.  Pt with h/o DM, HTN -- presents with nasal congestion x 2 days. Also some rhinorrhea. No fever, ear pain, sore throat, N/V/D. Has been taking mucinex and nyquil with some improvement. No CP, cough, SOB. Denies sick contacts, COVID contacts. The onset of this condition was acute. The course is constant. Aggravating factors: none.           Past Medical History:  Diagnosis Date  . Cellulitis   . Diabetes mellitus without complication (HCC)   . High cholesterol   . Hypertension   . Sleep apnea     Patient Active Problem List   Diagnosis Date Noted  . Essential hypertension 05/04/2016  . OSA on CPAP 03/10/2014  . Diabetes mellitus type 2, uncontrolled (HCC) 03/10/2014  . Morbid obesity (HCC) 03/01/2014    Past Surgical History:  Procedure Laterality Date  . debridement of leg    . none         Family History  Problem Relation Age of Onset  . Diabetes Mother   . Diabetes Sister     Social History   Tobacco Use  . Smoking status: Never Smoker  . Smokeless tobacco: Never Used  Substance Use Topics  . Alcohol use: Yes    Comment: occasionally  . Drug use: No    Home Medications Prior to Admission medications   Medication Sig Start Date End Date Taking? Authorizing Provider  atorvastatin (LIPITOR) 20 MG tablet TAKE ONE TABLET BY MOUTH ONCE DAILY. 07/30/17   Ofilia Neas, PA-C  HYDROcodone-acetaminophen (NORCO) 5-325 MG tablet Take 1-2 tablets by mouth every 6 (six) hours as needed. 08/31/18   Geoffery Lyons, MD  lisinopril (PRINIVIL,ZESTRIL) 10 MG tablet Take 1 tablet (10 mg total) by mouth daily. 07/30/17   Ofilia Neas, PA-C  metFORMIN (GLUCOPHAGE) 1000 MG tablet TAKE ONE TABLET BY MOUTH DAILY WITH A MEAL. 07/30/17   Ofilia Neas,  PA-C  Multiple Vitamin (MULTIVITAMIN WITH MINERALS) TABS tablet Take 1 tablet by mouth daily.    [provider]    Allergies    Penicillins  Review of Systems   Review of Systems  Constitutional: Negative for chills, fatigue and fever.  HENT: Positive for congestion, rhinorrhea, sinus pressure and sinus pain. Negative for ear pain and sore throat.   Eyes: Negative for redness.  Respiratory: Negative for cough and wheezing.   Gastrointestinal: Negative for abdominal pain, diarrhea, nausea and vomiting.  Genitourinary: Negative for dysuria.  Musculoskeletal: Negative for myalgias and neck stiffness.  Skin: Negative for rash.  Neurological: Negative for headaches.  Hematological: Negative for adenopathy.    Physical Exam Updated Vital Signs BP (!) 145/75 (BP Location: Left Arm)   Pulse 98   Temp 99.1 F (37.3 C) (Oral)   Resp 18   Ht 5\' 9"  (1.753 m)   Wt (!) 149.7 kg   SpO2 100%   BMI 48.73 kg/m   Physical Exam Vitals and nursing note reviewed.  Constitutional:      Appearance: He is well-developed.  HENT:     Head: Normocephalic and atraumatic.     Jaw: No trismus.     Right Ear: Tympanic membrane, ear canal and external ear normal.     Left Ear: Tympanic  membrane, ear canal and external ear normal.     Nose: Congestion present. No mucosal edema or rhinorrhea.     Right Sinus: Maxillary sinus tenderness and frontal sinus tenderness present.     Left Sinus: No maxillary sinus tenderness or frontal sinus tenderness.     Mouth/Throat:     Mouth: Mucous membranes are not dry.     Pharynx: Uvula midline. No oropharyngeal exudate, posterior oropharyngeal erythema or uvula swelling.     Tonsils: No tonsillar abscesses.  Eyes:     General:        Right eye: No discharge.        Left eye: No discharge.     Conjunctiva/sclera: Conjunctivae normal.  Cardiovascular:     Rate and Rhythm: Normal rate and regular rhythm.     Heart sounds: Normal heart sounds.    Pulmonary:     Effort: Pulmonary effort is normal. No respiratory distress.     Breath sounds: Normal breath sounds. No wheezing or rales.  Abdominal:     Palpations: Abdomen is soft.     Tenderness: There is no abdominal tenderness.  Musculoskeletal:     Cervical back: Normal range of motion and neck supple.  Skin:    General: Skin is warm and dry.  Neurological:     Mental Status: He is alert.     ED Results / Procedures / Treatments   Labs (all labs ordered are listed, but only abnormal results are displayed) Labs Reviewed - No data to display  EKG None  Radiology No results found.  Procedures Procedures (including critical care time)  Medications Ordered in ED Medications - No data to display  ED Course  I have reviewed the triage vital signs and the nursing notes.  Pertinent labs & imaging results that were available during my care of the patient were reviewed by me and considered in my medical decision making (see chart for details).  Patient seen and examined.Pt counseled on sinusitis. He will continue OTC meds. Discussed wait and see approach. Will give doxycycline and patient will fill if worsening or no improvement in 72 hrs.   Vital signs reviewed and are as follows: BP (!) 145/75 (BP Location: Left Arm)   Pulse 98   Temp 99.1 F (37.3 C) (Oral)   Resp 18   Ht 5\' 9"  (1.753 m)   Wt (!) 149.7 kg   SpO2 100%   BMI 48.73 kg/m       MDM Rules/Calculators/A&P                      Patient with sinusitis, looks well. Treatment as above. Vitals reassuring. Temp 99.1.     Final Clinical Impression(s) / ED Diagnoses Final diagnoses:  Acute non-recurrent frontal sinusitis    Rx / DC Orders ED Discharge Orders    None       Carlisle Cater, PA-C 05/10/19 1451    Davonna Belling, MD 05/14/19 [phone]

## 2019-05-10 NOTE — ED Triage Notes (Signed)
Pt c/o nasal congestion x couple days. Pt reports green/yellow colored secretions when he blows his nose.

## 2019-05-10 NOTE — Discharge Instructions (Signed)
Please read and follow all provided instructions.  Your diagnoses today include:  1. Acute non-recurrent frontal sinusitis    Tests performed today include:  Vital signs. See below for your results today.   Medications prescribed:   Doxycycline - antibiotic  Only fill this antibiotic if you worsen or continue to have symptoms in 72 hours. If filled, take entire course of antibiotics as directed and follow-up with your doctor.  You have been prescribed an antibiotic medicine: take the entire course of medicine even if you are feeling better. Stopping early can cause the antibiotic not to work.  Take any prescribed medications only as directed. Treatment for your infection is aimed at treating the symptoms. There are no medications, such as antibiotics, that will cure your infection.   Home care instructions:  Follow any educational materials contained in this packet.   Your illness is contagious and can be spread to others, especially during the first 3 or 4 days. It cannot be cured by antibiotics or other medicines. Take basic precautions such as washing your hands often, covering your mouth when you cough or sneeze, and avoiding public places where you could spread your illness to others.   Please continue drinking plenty of fluids.  Use over-the-counter medicines as needed as directed on packaging for symptom relief.  You may also use ibuprofen or tylenol as directed on packaging for pain or fever.  Do not take multiple medicines containing Tylenol or acetaminophen to avoid taking too much of this medication.  Follow-up instructions: Please follow-up with your primary care provider in the next 3 days for further evaluation of your symptoms if you are not feeling better.   Return instructions:   Please return to the Emergency Department if you experience worsening symptoms.   RETURN IMMEDIATELY IF you develop shortness of breath, confusion or altered mental status, a new rash,  become dizzy, faint, or poorly responsive, or are unable to be cared for at home.  Please return if you have persistent vomiting and cannot keep down fluids or develop a fever that is not controlled by tylenol or motrin.    Please return if you have any other emergent concerns.  Additional Information:  Your vital signs today were: BP (!) 145/75 (BP Location: Left Arm)   Pulse 98   Temp 99.1 F (37.3 C) (Oral)   Resp 18   Ht 5\' 9"  (1.753 m)   Wt (!) 149.7 kg   SpO2 100%   BMI 48.73 kg/m  If your blood pressure (BP) was elevated above 135/85 this visit, please have this repeated by your doctor within one month. --------------

## 2021-07-10 ENCOUNTER — Encounter (HOSPITAL_BASED_OUTPATIENT_CLINIC_OR_DEPARTMENT_OTHER): Payer: Self-pay | Admitting: Emergency Medicine

## 2021-07-10 ENCOUNTER — Other Ambulatory Visit: Payer: Self-pay

## 2021-07-10 ENCOUNTER — Emergency Department (HOSPITAL_BASED_OUTPATIENT_CLINIC_OR_DEPARTMENT_OTHER)
Admission: EM | Admit: 2021-07-10 | Discharge: 2021-07-11 | Disposition: A | Discharge status: Home / Self Care | Payer: Commercial Managed Care - PPO | Attending: Emergency Medicine | Admitting: Emergency Medicine

## 2021-07-10 ENCOUNTER — Emergency Department (HOSPITAL_BASED_OUTPATIENT_CLINIC_OR_DEPARTMENT_OTHER): Payer: Commercial Managed Care - PPO

## 2021-07-10 DIAGNOSIS — R059 Cough, unspecified: Secondary | ICD-10-CM | POA: Diagnosis present

## 2021-07-10 DIAGNOSIS — E1165 Type 2 diabetes mellitus with hyperglycemia: Secondary | ICD-10-CM | POA: Diagnosis not present

## 2021-07-10 DIAGNOSIS — R739 Hyperglycemia, unspecified: Secondary | ICD-10-CM

## 2021-07-10 DIAGNOSIS — Z7984 Long term (current) use of oral hypoglycemic drugs: Secondary | ICD-10-CM | POA: Diagnosis not present

## 2021-07-10 DIAGNOSIS — I10 Essential (primary) hypertension: Secondary | ICD-10-CM | POA: Diagnosis not present

## 2021-07-10 DIAGNOSIS — R0602 Shortness of breath: Secondary | ICD-10-CM | POA: Insufficient documentation

## 2021-07-10 DIAGNOSIS — R051 Acute cough: Secondary | ICD-10-CM | POA: Insufficient documentation

## 2021-07-10 DIAGNOSIS — E860 Dehydration: Secondary | ICD-10-CM | POA: Insufficient documentation

## 2021-07-10 DIAGNOSIS — Z79899 Other long term (current) drug therapy: Secondary | ICD-10-CM | POA: Insufficient documentation

## 2021-07-10 LAB — CBC WITH DIFFERENTIAL/PLATELET
Abs Immature Granulocytes: 0.03 10*3/uL (ref 0.00–0.07)
Basophils Absolute: 0.1 10*3/uL (ref 0.0–0.1)
Basophils Relative: 1 %
Eosinophils Absolute: 0.2 10*3/uL (ref 0.0–0.5)
Eosinophils Relative: 3 %
HCT: 40.5 % (ref 39.0–52.0)
Hemoglobin: 14.1 g/dL (ref 13.0–17.0)
Immature Granulocytes: 0 %
Lymphocytes Relative: 29 %
Lymphs Abs: 2.7 10*3/uL (ref 0.7–4.0)
MCH: 31.5 pg (ref 26.0–34.0)
MCHC: 34.8 g/dL (ref 30.0–36.0)
MCV: 90.6 fL (ref 80.0–100.0)
Monocytes Absolute: 0.8 10*3/uL (ref 0.1–1.0)
Monocytes Relative: 8 %
Neutro Abs: 5.8 10*3/uL (ref 1.7–7.7)
Neutrophils Relative %: 59 %
Platelets: 236 10*3/uL (ref 150–400)
RBC: 4.47 MIL/uL (ref 4.22–5.81)
RDW: 13 % (ref 11.5–15.5)
WBC: 9.6 10*3/uL (ref 4.0–10.5)
nRBC: 0 % (ref 0.0–0.2)

## 2021-07-10 LAB — GROUP A STREP BY PCR: Group A Strep by PCR: NOT DETECTED

## 2021-07-10 MED ORDER — PANTOPRAZOLE SODIUM 40 MG IV SOLR
40.0000 mg | Freq: Once | INTRAVENOUS | Status: AC
Start: 2021-07-10 — End: 2021-07-10
  Administered 2021-07-10: 40 mg via INTRAVENOUS
  Filled 2021-07-10: qty 10

## 2021-07-10 NOTE — ED Triage Notes (Signed)
Cough x 3 weeks, worse when laying down at night to sleep. Decrease in energy x 3 weeks.  ?

## 2021-07-10 NOTE — ED Provider Notes (Signed)
? ?[facility] ?Provider Note: Lowella DellJ. Lane Dolphus Linch, MD, FACEP ? ?CSN: [id number] ?MRN: [id number] ?ARRIVAL: 07/10/21 at 2154 ?ROOM: [facility] ? ? ?CHIEF COMPLAINT  ?Cough and Sore Throat ? ? ?HISTORY OF PRESENT ILLNESS  ?07/10/21 11:18 PM ?[name] is a 41 y.o. male with cough and decreased energy for the past 3 weeks.  It is somewhat worse when lying down to sleep unless he takes DayQuil or NyQuil first.  He is not currently on an ACE inhibitor.  He is not on any medications for acid reflux.  He states it is often hard to take a deep breath.  He has not had a fever.  He has not had any gastrointestinal symptoms.  His throat is somewhat sore as well. ? ? ?Past Medical History:  ?Diagnosis Date  ? Cellulitis   ? Diabetes mellitus without complication (HCC)   ? High cholesterol   ? Hypertension   ? Sleep apnea   ? ? ?Past Surgical History:  ?Procedure Laterality Date  ? debridement of leg    ? none    ? ? ?Family History  ?Problem Relation Age of Onset  ? Diabetes Mother   ? Diabetes Sister   ? ? ?Social History  ? ?Tobacco Use  ? Smoking status: Never  ? Smokeless tobacco: Never  ?Vaping Use  ? Vaping Use: Never used  ?Substance Use Topics  ? Alcohol use: Yes  ?  Comment: occasionally  ? Drug use: No  ? ? ?Prior to Admission medications   ?Medication Sig Start Date End Date Taking? Authorizing Provider  ?pantoprazole (PROTONIX) 40 MG tablet Take 1 tablet (40 mg total) by mouth daily. 07/11/21  Yes Camarie Mctigue, MD  ?atorvastatin (LIPITOR) 20 MG tablet TAKE ONE TABLET BY MOUTH ONCE DAILY. 07/30/17   Ofilia Neaslark, Michael L, PA-C  ?lisinopril (PRINIVIL,ZESTRIL) 10 MG tablet Take 1 tablet (10 mg total) by mouth daily. 07/30/17   Ofilia Neaslark, Michael L, PA-C  ?metFORMIN (GLUCOPHAGE) 1000 MG tablet TAKE ONE TABLET BY MOUTH DAILY WITH A MEAL. 07/30/17   Ofilia Neaslark, Michael L, PA-C  ?Multiple Vitamin (MULTIVITAMIN WITH MINERALS) TABS tablet Take 1 tablet by mouth daily.    [provider]  ? ? ?Allergies ?Penicillins ? ? ?REVIEW  OF SYSTEMS  ?Negative except as noted here or in the History of Present Illness. ? ? ?PHYSICAL EXAMINATION  ?Initial Vital Signs ?Blood pressure 138/89, pulse (!) 109, temperature 98.9 ?F (37.2 ?C), temperature source Oral, resp. rate 20, height 5\' 8"  (1.727 m), weight (!) 181.4 kg, SpO2 95 %. ? ?Examination ?General: Well-developed, high BMI male in no acute distress; appearance consistent with age of record ?HENT: normocephalic; atraumatic no pharyngeal erythema or exudate ?Eyes: Normal appearance ?Neck: supple ?Heart: regular rate and rhythm ?Lungs: clear to auscultation bilaterally ?Abdomen: soft; nondistended; nontender; bowel sounds present ?Extremities: No deformity; full range of motion; pulses normal ?Neurologic: Awake, alert and oriented; motor function intact in all extremities and symmetric; no facial droop ?Skin: Warm and dry ?Psychiatric: Normal mood and affect ? ? ?RESULTS  ?Summary of this visit's results, reviewed and interpreted by myself: ? ? EKG Interpretation ? ?Date/Time:    ?Ventricular Rate:    ?PR Interval:    ?QRS Duration:   ?QT Interval:    ?QTC Calculation:   ?R Axis:     ?Text Interpretation:   ?  ? ?  ? ?Laboratory Studies: ?Results for orders placed or performed during the hospital encounter of 07/10/21 (from the past 24 hour(s))  ?  Group A Strep by PCR     Status: None  ? Collection Time: 07/10/21 10:14 PM  ? Specimen: Throat; Sterile Swab  ?Result Value Ref Range  ? Group A Strep by PCR NOT DETECTED NOT DETECTED  ?CBC with Differential     Status: None  ? Collection Time: 07/10/21 11:45 PM  ?Result Value Ref Range  ? WBC 9.6 4.0 - 10.5 K/uL  ? RBC 4.47 4.22 - 5.81 MIL/uL  ? Hemoglobin 14.1 13.0 - 17.0 g/dL  ? HCT 40.5 39.0 - 52.0 %  ? MCV 90.6 80.0 - 100.0 fL  ? MCH 31.5 26.0 - 34.0 pg  ? MCHC 34.8 30.0 - 36.0 g/dL  ? RDW 13.0 11.5 - 15.5 %  ? Platelets 236 150 - 400 K/uL  ? nRBC 0.0 0.0 - 0.2 %  ? Neutrophils Relative % 59 %  ? Neutro Abs 5.8 1.7 - 7.7 K/uL  ? Lymphocytes Relative  29 %  ? Lymphs Abs 2.7 0.7 - 4.0 K/uL  ? Monocytes Relative 8 %  ? Monocytes Absolute 0.8 0.1 - 1.0 K/uL  ? Eosinophils Relative 3 %  ? Eosinophils Absolute 0.2 0.0 - 0.5 K/uL  ? Basophils Relative 1 %  ? Basophils Absolute 0.1 0.0 - 0.1 K/uL  ? Immature Granulocytes 0 %  ? Abs Immature Granulocytes 0.03 0.00 - 0.07 K/uL  ?Basic metabolic panel     Status: Abnormal  ? Collection Time: 07/10/21 11:45 PM  ?Result Value Ref Range  ? Sodium 139 135 - 145 mmol/L  ? Potassium 3.8 3.5 - 5.1 mmol/L  ? Chloride 105 98 - 111 mmol/L  ? CO2 24 22 - 32 mmol/L  ? Glucose, Bld 233 (H) 70 - 99 mg/dL  ? BUN 17 6 - 20 mg/dL  ? Creatinine, Ser 0.79 0.61 - 1.24 mg/dL  ? Calcium 8.7 (L) 8.9 - 10.3 mg/dL  ? GFR, Estimated >60 >60 mL/min  ? Anion gap 10 5 - 15  ?Urinalysis, Routine w reflex microscopic     Status: Abnormal  ? Collection Time: 07/11/21  1:00 AM  ?Result Value Ref Range  ? Color, Urine YELLOW YELLOW  ? APPearance CLEAR CLEAR  ? Specific Gravity, Urine >=1.030 1.005 - 1.030  ? pH 5.5 5.0 - 8.0  ? Glucose, UA NEGATIVE NEGATIVE mg/dL  ? Hgb urine dipstick NEGATIVE NEGATIVE  ? Bilirubin Urine NEGATIVE NEGATIVE  ? Ketones, ur NEGATIVE NEGATIVE mg/dL  ? Protein, ur 100 (A) NEGATIVE mg/dL  ? Nitrite NEGATIVE NEGATIVE  ? Leukocytes,Ua NEGATIVE NEGATIVE  ?Urinalysis, Microscopic (reflex)     Status: Abnormal  ? Collection Time: 07/11/21  1:00 AM  ?Result Value Ref Range  ? RBC / HPF 0-5 0 - 5 RBC/hpf  ? WBC, UA 0-5 0 - 5 WBC/hpf  ? Bacteria, UA RARE (A) NONE SEEN  ? Squamous Epithelial / LPF 0-5 0 - 5  ? Mucus PRESENT   ? Hyaline Casts, UA PRESENT   ? ?Imaging Studies: ?DG Chest 2 View ? ?Result Date: 07/10/2021 ?CLINICAL DATA:  Cough EXAM: CHEST - 2 VIEW COMPARISON:  08/31/2018 FINDINGS: The heart size and mediastinal contours are within normal limits. Both lungs are clear. The visualized skeletal structures are unremarkable. IMPRESSION: No active cardiopulmonary disease. Electronically Signed   By: Charlett Nose M.D.   On: 07/10/2021  22:28   ? ?ED COURSE and MDM  ?Nursing notes, initial and subsequent vitals signs, including pulse oximetry, reviewed and interpreted by myself. ? ?Vitals:  ?  07/10/21 2206 07/10/21 2213 07/11/21 0009 07/11/21 0214  ?BP:  138/89 126/78 128/73  ?Pulse:  (!) 109 99 89  ?Resp:  20 20 18   ?Temp:  98.9 ?F (37.2 ?C)    ?TempSrc:  Oral    ?SpO2:  95% 95% 98%  ?Weight: (!) 181.4 kg     ?Height: 5\' 8"  (1.727 m)     ? ?Medications  ?pantoprazole (PROTONIX) injection 40 mg (40 mg Intravenous Given 07/10/21 2343)  ?sodium chloride 0.9 % bolus 1,000 mL ( Intravenous Stopped 07/11/21 0233)  ? ?2:38 AM ?Patient feeling better after IV fluid bolus.  His general malaise may be due to dehydration.  His sugar is not well controlled but the patient states he will improve his diet in an effort to keep his sugar and check.  His cough and sore throat may be due to acid reflux and we will start him on a PPI. ? ? ?PROCEDURES  ?Procedures ? ? ?ED DIAGNOSES  ? ?  ICD-10-CM   ?1. Dehydration  E86.0   ?  ?2. Hyperglycemia  R73.9   ?  ?3. Acute cough  R05.1   ?  ? ? ? ?  ?[id number], MD ?07/11/21 0241 ? ?

## 2021-07-11 ENCOUNTER — Encounter (HOSPITAL_BASED_OUTPATIENT_CLINIC_OR_DEPARTMENT_OTHER): Payer: Self-pay | Admitting: Emergency Medicine

## 2021-07-11 LAB — BASIC METABOLIC PANEL
Anion gap: 10 (ref 5–15)
BUN: 17 mg/dL (ref 6–20)
CO2: 24 mmol/L (ref 22–32)
Calcium: 8.7 mg/dL — ABNORMAL LOW (ref 8.9–10.3)
Chloride: 105 mmol/L (ref 98–111)
Creatinine, Ser: 0.79 mg/dL (ref 0.61–1.24)
GFR, Estimated: >60 mL/min (ref >60)
Glucose, Bld: 233 mg/dL — ABNORMAL HIGH (ref 70–99)
Potassium: 3.8 mmol/L (ref 3.5–5.1)
Sodium: 139 mmol/L (ref 135–145)

## 2021-07-11 LAB — URINALYSIS, ROUTINE W REFLEX MICROSCOPIC
Bilirubin Urine: NEGATIVE
Glucose, UA: NEGATIVE mg/dL
Hgb urine dipstick: NEGATIVE
Ketones, ur: NEGATIVE mg/dL
Leukocytes,Ua: NEGATIVE
Nitrite: NEGATIVE
Protein, ur: 100 mg/dL — AB
Specific Gravity, Urine: >=1.03 (ref 1.005–1.030)
pH: 5.5 (ref 5.0–8.0)

## 2021-07-11 LAB — URINALYSIS, MICROSCOPIC (REFLEX)

## 2021-07-11 LAB — TSH: TSH: 4.007 u[IU]/mL (ref 0.350–4.500)

## 2021-07-11 MED ORDER — SODIUM CHLORIDE 0.9 % IV BOLUS
1000.0000 mL | Freq: Once | INTRAVENOUS | Status: AC
  Administered 2021-07-11: 1000 mL via INTRAVENOUS

## 2021-07-11 MED ORDER — PANTOPRAZOLE SODIUM 40 MG PO TBEC: 40.0000 mg | DELAYED_RELEASE_TABLET | Freq: Every day | ORAL | 0 refills | Status: AC

## 2021-07-11 NOTE — ED Notes (Signed)
Pt encouraged to urinate, given water ?

## 2021-07-11 NOTE — ED Notes (Signed)
Pt NAD, a/ox4. Pt verbalizes understanding of all DC and f/u instructions. All questions answered. Pt walks with steady gait to lobby at DC.  ? ?
